# Patient Record
Sex: Male | Born: 1945 | Race: White | Hispanic: No | Marital: Married | Smoking: Never smoker
Health system: Southern US, Community
[De-identification: ages and names within clinical notes are randomized; demographics above are authoritative.]

## PROBLEM LIST (undated history)

## (undated) DIAGNOSIS — M109 Gout, unspecified: Secondary | ICD-10-CM

## (undated) DIAGNOSIS — G44309 Post-traumatic headache, unspecified, not intractable: Secondary | ICD-10-CM

## (undated) DIAGNOSIS — M1990 Unspecified osteoarthritis, unspecified site: Secondary | ICD-10-CM

## (undated) DIAGNOSIS — E1169 Type 2 diabetes mellitus with other specified complication: Secondary | ICD-10-CM

## (undated) DIAGNOSIS — E785 Hyperlipidemia, unspecified: Secondary | ICD-10-CM

## (undated) DIAGNOSIS — H9193 Unspecified hearing loss, bilateral: Secondary | ICD-10-CM

## (undated) DIAGNOSIS — I10 Essential (primary) hypertension: Secondary | ICD-10-CM

## (undated) DIAGNOSIS — F410 Panic disorder [episodic paroxysmal anxiety] without agoraphobia: Secondary | ICD-10-CM

## (undated) DIAGNOSIS — R972 Elevated prostate specific antigen [PSA]: Secondary | ICD-10-CM

## (undated) DIAGNOSIS — N401 Enlarged prostate with lower urinary tract symptoms: Secondary | ICD-10-CM

## (undated) HISTORY — DX: Post-traumatic headache, unspecified, not intractable: G44.309

## (undated) HISTORY — PX: CATARACT EXTRACTION: SUR2

## (undated) HISTORY — PX: SKIN CANCER EXCISION: SHX779

## (undated) HISTORY — PX: TOTAL KNEE ARTHROPLASTY: SHX125

## (undated) HISTORY — DX: Hyperlipidemia, unspecified: E78.5

## (undated) HISTORY — DX: Unspecified hearing loss, bilateral: H91.93

## (undated) HISTORY — DX: Elevated prostate specific antigen (PSA): R97.20

## (undated) HISTORY — DX: Panic disorder (episodic paroxysmal anxiety): F41.0

## (undated) HISTORY — DX: Type 2 diabetes mellitus with other specified complication: E11.69

## (undated) HISTORY — DX: Benign prostatic hyperplasia with lower urinary tract symptoms: N40.1

## (undated) HISTORY — DX: Gout, unspecified: M10.9

---
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
## 2005-06-03 ENCOUNTER — Encounter: Admission: RE | Admit: 2005-06-03 | Discharge: 2005-06-03 | Payer: Self-pay | Admitting: Orthopedic Surgery

## 2005-06-04 ENCOUNTER — Encounter: Admission: RE | Admit: 2005-06-04 | Discharge: 2005-06-04 | Payer: Self-pay | Admitting: Orthopedic Surgery

## 2005-07-15 ENCOUNTER — Encounter: Admission: RE | Admit: 2005-07-15 | Discharge: 2005-07-15 | Payer: Self-pay | Admitting: Orthopedic Surgery

## 2005-07-29 ENCOUNTER — Encounter: Admission: RE | Admit: 2005-07-29 | Discharge: 2005-07-29 | Payer: Self-pay | Admitting: Orthopedic Surgery

## 2005-09-02 ENCOUNTER — Encounter: Admission: RE | Admit: 2005-09-02 | Discharge: 2005-09-02 | Payer: Self-pay | Admitting: Orthopedic Surgery

## 2008-10-03 HISTORY — PX: TOTAL KNEE ARTHROPLASTY: SHX125

## 2010-01-08 ENCOUNTER — Inpatient Hospital Stay (HOSPITAL_COMMUNITY): Admission: RE | Admit: 2010-01-08 | Discharge: 2010-01-11 | Payer: Self-pay | Admitting: Orthopedic Surgery

## 2010-10-23 ENCOUNTER — Encounter: Payer: Self-pay | Admitting: Orthopedic Surgery

## 2010-12-22 LAB — CBC
HCT: 29.8 % — ABNORMAL LOW (ref 39.0–52.0)
HCT: 32.4 % — ABNORMAL LOW (ref 39.0–52.0)
HCT: 33.8 % — ABNORMAL LOW (ref 39.0–52.0)
HCT: 43.8 % (ref 39.0–52.0)
Hemoglobin: 10.4 g/dL — ABNORMAL LOW (ref 13.0–17.0)
Hemoglobin: 11.3 g/dL — ABNORMAL LOW (ref 13.0–17.0)
Hemoglobin: 11.7 g/dL — ABNORMAL LOW (ref 13.0–17.0)
Hemoglobin: 15.1 g/dL (ref 13.0–17.0)
MCHC: 34.4 g/dL (ref 30.0–36.0)
MCHC: 34.6 g/dL (ref 30.0–36.0)
MCHC: 34.8 g/dL (ref 30.0–36.0)
MCHC: 34.8 g/dL (ref 30.0–36.0)
MCV: 95.4 fL (ref 78.0–100.0)
MCV: 95.7 fL (ref 78.0–100.0)
MCV: 95.9 fL (ref 78.0–100.0)
MCV: 96.7 fL (ref 78.0–100.0)
Platelets: 177 10*3/uL (ref 150–400)
Platelets: 186 10*3/uL (ref 150–400)
Platelets: 207 10*3/uL (ref 150–400)
Platelets: 244 10*3/uL (ref 150–400)
RBC: 3.11 MIL/uL — ABNORMAL LOW (ref 4.22–5.81)
RBC: 3.35 MIL/uL — ABNORMAL LOW (ref 4.22–5.81)
RBC: 3.53 MIL/uL — ABNORMAL LOW (ref 4.22–5.81)
RBC: 4.59 MIL/uL (ref 4.22–5.81)
RDW: 13.2 % (ref 11.5–15.5)
RDW: 13.4 % (ref 11.5–15.5)
RDW: 13.4 % (ref 11.5–15.5)
RDW: 13.4 % (ref 11.5–15.5)
WBC: 10.1 10*3/uL (ref 4.0–10.5)
WBC: 12.3 10*3/uL — ABNORMAL HIGH (ref 4.0–10.5)
WBC: 13.9 10*3/uL — ABNORMAL HIGH (ref 4.0–10.5)
WBC: 9.4 10*3/uL (ref 4.0–10.5)

## 2010-12-22 LAB — ABO/RH: ABO/RH(D): A POS

## 2010-12-22 LAB — BASIC METABOLIC PANEL
BUN: 15 mg/dL (ref 6–23)
BUN: 15 mg/dL (ref 6–23)
BUN: 19 mg/dL (ref 6–23)
CO2: 27 mEq/L (ref 19–32)
CO2: 28 mEq/L (ref 19–32)
CO2: 28 mEq/L (ref 19–32)
Calcium: 8 mg/dL — ABNORMAL LOW (ref 8.4–10.5)
Calcium: 8.1 mg/dL — ABNORMAL LOW (ref 8.4–10.5)
Calcium: 8.2 mg/dL — ABNORMAL LOW (ref 8.4–10.5)
Chloride: 100 mEq/L (ref 96–112)
Chloride: 102 mEq/L (ref 96–112)
Chloride: 102 mEq/L (ref 96–112)
Creatinine, Ser: 1.08 mg/dL (ref 0.4–1.5)
Creatinine, Ser: 1.2 mg/dL (ref 0.4–1.5)
Creatinine, Ser: 1.46 mg/dL (ref 0.4–1.5)
GFR calc Af Amer: 59 mL/min — ABNORMAL LOW (ref >60)
GFR calc Af Amer: >60 mL/min (ref >60)
GFR calc Af Amer: >60 mL/min (ref >60)
GFR calc non Af Amer: 48 mL/min — ABNORMAL LOW (ref >60)
GFR calc non Af Amer: >60 mL/min (ref >60)
GFR calc non Af Amer: >60 mL/min (ref >60)
Glucose, Bld: 114 mg/dL — ABNORMAL HIGH (ref 70–99)
Glucose, Bld: 122 mg/dL — ABNORMAL HIGH (ref 70–99)
Glucose, Bld: 164 mg/dL — ABNORMAL HIGH (ref 70–99)
Potassium: 3.5 mEq/L (ref 3.5–5.1)
Potassium: 3.9 mEq/L (ref 3.5–5.1)
Potassium: 3.9 mEq/L (ref 3.5–5.1)
Sodium: 134 mEq/L — ABNORMAL LOW (ref 135–145)
Sodium: 135 mEq/L (ref 135–145)
Sodium: 136 mEq/L (ref 135–145)

## 2010-12-22 LAB — COMPREHENSIVE METABOLIC PANEL
ALT: 33 U/L (ref 0–53)
AST: 24 U/L (ref 0–37)
Albumin: 4.1 g/dL (ref 3.5–5.2)
Alkaline Phosphatase: 69 U/L (ref 39–117)
BUN: 18 mg/dL (ref 6–23)
CO2: 30 mEq/L (ref 19–32)
Calcium: 9.6 mg/dL (ref 8.4–10.5)
Chloride: 104 mEq/L (ref 96–112)
Creatinine, Ser: 1.24 mg/dL (ref 0.4–1.5)
GFR calc Af Amer: >60 mL/min (ref >60)
GFR calc non Af Amer: 59 mL/min — ABNORMAL LOW (ref >60)
Glucose, Bld: 100 mg/dL — ABNORMAL HIGH (ref 70–99)
Potassium: 3.9 mEq/L (ref 3.5–5.1)
Sodium: 140 mEq/L (ref 135–145)
Total Bilirubin: 0.6 mg/dL (ref 0.3–1.2)
Total Protein: 6.8 g/dL (ref 6.0–8.3)

## 2010-12-22 LAB — TYPE AND SCREEN
ABO/RH(D): A POS
Antibody Screen: NEGATIVE

## 2010-12-22 LAB — URINALYSIS, ROUTINE W REFLEX MICROSCOPIC
Bilirubin Urine: NEGATIVE
Glucose, UA: NEGATIVE mg/dL
Ketones, ur: NEGATIVE mg/dL
Leukocytes, UA: NEGATIVE
Nitrite: NEGATIVE
Protein, ur: NEGATIVE mg/dL
Specific Gravity, Urine: 1.017 (ref 1.005–1.030)
Urobilinogen, UA: 0.2 mg/dL (ref 0.0–1.0)
pH: 6 (ref 5.0–8.0)

## 2010-12-22 LAB — DIFFERENTIAL
Basophils Absolute: 0.1 10*3/uL (ref 0.0–0.1)
Basophils Relative: 1 % (ref 0–1)
Eosinophils Absolute: 0.2 10*3/uL (ref 0.0–0.7)
Eosinophils Relative: 2 % (ref 0–5)
Lymphocytes Relative: 31 % (ref 12–46)
Lymphs Abs: 2.9 10*3/uL (ref 0.7–4.0)
Monocytes Absolute: 1 10*3/uL (ref 0.1–1.0)
Monocytes Relative: 11 % (ref 3–12)
Neutro Abs: 5.2 10*3/uL (ref 1.7–7.7)
Neutrophils Relative %: 56 % (ref 43–77)

## 2010-12-22 LAB — PROTIME-INR
INR: 0.96 (ref 0.00–1.49)
Prothrombin Time: 12.7 seconds (ref 11.6–15.2)

## 2010-12-22 LAB — URINE CULTURE
Colony Count: NO GROWTH
Culture: NO GROWTH

## 2010-12-22 LAB — URINE MICROSCOPIC-ADD ON

## 2010-12-22 LAB — APTT: aPTT: 26 seconds (ref 24–37)

## 2013-10-03 HISTORY — PX: CATARACT EXTRACTION: SUR2

## 2014-09-10 ENCOUNTER — Emergency Department (HOSPITAL_COMMUNITY): Payer: Commercial Managed Care - HMO

## 2014-09-10 ENCOUNTER — Encounter (HOSPITAL_COMMUNITY): Payer: Self-pay | Admitting: Vascular Surgery

## 2014-09-10 ENCOUNTER — Emergency Department (HOSPITAL_COMMUNITY)
Admission: EM | Admit: 2014-09-10 | Discharge: 2014-09-10 | Disposition: A | Discharge status: Home / Self Care | Payer: Commercial Managed Care - HMO | Attending: Emergency Medicine | Admitting: Emergency Medicine

## 2014-09-10 DIAGNOSIS — L089 Local infection of the skin and subcutaneous tissue, unspecified: Secondary | ICD-10-CM | POA: Diagnosis not present

## 2014-09-10 DIAGNOSIS — S1081XA Abrasion of other specified part of neck, initial encounter: Secondary | ICD-10-CM

## 2014-09-10 DIAGNOSIS — S02401A Maxillary fracture, unspecified, initial encounter for closed fracture: Secondary | ICD-10-CM | POA: Diagnosis not present

## 2014-09-10 DIAGNOSIS — S0990XA Unspecified injury of head, initial encounter: Secondary | ICD-10-CM | POA: Diagnosis present

## 2014-09-10 DIAGNOSIS — Y9289 Other specified places as the place of occurrence of the external cause: Secondary | ICD-10-CM | POA: Insufficient documentation

## 2014-09-10 DIAGNOSIS — Y998 Other external cause status: Secondary | ICD-10-CM | POA: Insufficient documentation

## 2014-09-10 DIAGNOSIS — S3992XA Unspecified injury of lower back, initial encounter: Secondary | ICD-10-CM

## 2014-09-10 DIAGNOSIS — Y9389 Activity, other specified: Secondary | ICD-10-CM | POA: Insufficient documentation

## 2014-09-10 DIAGNOSIS — S0081XA Abrasion of other part of head, initial encounter: Secondary | ICD-10-CM | POA: Insufficient documentation

## 2014-09-10 DIAGNOSIS — S199XXA Unspecified injury of neck, initial encounter: Secondary | ICD-10-CM

## 2014-09-10 DIAGNOSIS — W01198A Fall on same level from slipping, tripping and stumbling with subsequent striking against other object, initial encounter: Secondary | ICD-10-CM | POA: Insufficient documentation

## 2014-09-10 DIAGNOSIS — S20212A Contusion of left front wall of thorax, initial encounter: Secondary | ICD-10-CM | POA: Diagnosis not present

## 2014-09-10 DIAGNOSIS — I10 Essential (primary) hypertension: Secondary | ICD-10-CM | POA: Insufficient documentation

## 2014-09-10 HISTORY — DX: Essential (primary) hypertension: I10

## 2014-09-10 LAB — I-STAT CHEM 8, ED
BUN: 16 mg/dL (ref 6–23)
CALCIUM ION: 1.16 mmol/L (ref 1.13–1.30)
CHLORIDE: 103 meq/L (ref 96–112)
Creatinine, Ser: 1.1 mg/dL (ref 0.50–1.35)
GLUCOSE: 132 mg/dL — AB (ref 70–99)
HCT: 48 % (ref 39.0–52.0)
Hemoglobin: 16.3 g/dL (ref 13.0–17.0)
Potassium: 3.8 mEq/L (ref 3.7–5.3)
Sodium: 141 mEq/L (ref 137–147)
TCO2: 24 mmol/L (ref 0–100)

## 2014-09-10 LAB — URINALYSIS, ROUTINE W REFLEX MICROSCOPIC
Bilirubin Urine: NEGATIVE
Glucose, UA: NEGATIVE mg/dL
Hgb urine dipstick: NEGATIVE
KETONES UR: NEGATIVE mg/dL
Leukocytes, UA: NEGATIVE
NITRITE: NEGATIVE
PH: 7 (ref 5.0–8.0)
Protein, ur: NEGATIVE mg/dL
Specific Gravity, Urine: 1.015 (ref 1.005–1.030)
Urobilinogen, UA: 0.2 mg/dL (ref 0.0–1.0)

## 2014-09-10 LAB — HEPATIC FUNCTION PANEL
ALK PHOS: 59 U/L (ref 39–117)
ALT: 45 U/L (ref 0–53)
AST: 33 U/L (ref 0–37)
Albumin: 3.8 g/dL (ref 3.5–5.2)
Bilirubin, Direct: <0.2 mg/dL (ref 0.0–0.3)
TOTAL PROTEIN: 6.9 g/dL (ref 6.0–8.3)
Total Bilirubin: 0.6 mg/dL (ref 0.3–1.2)

## 2014-09-10 LAB — CBC WITH DIFFERENTIAL/PLATELET
Basophils Absolute: 0 10*3/uL (ref 0.0–0.1)
Basophils Relative: 0 % (ref 0–1)
EOS ABS: 0 10*3/uL (ref 0.0–0.7)
Eosinophils Relative: 0 % (ref 0–5)
HEMATOCRIT: 43 % (ref 39.0–52.0)
HEMOGLOBIN: 14.6 g/dL (ref 13.0–17.0)
Lymphocytes Relative: 8 % — ABNORMAL LOW (ref 12–46)
Lymphs Abs: 1.1 10*3/uL (ref 0.7–4.0)
MCH: 31.9 pg (ref 26.0–34.0)
MCHC: 34 g/dL (ref 30.0–36.0)
MCV: 93.9 fL (ref 78.0–100.0)
MONO ABS: 0.9 10*3/uL (ref 0.1–1.0)
MONOS PCT: 7 % (ref 3–12)
NEUTROS PCT: 85 % — AB (ref 43–77)
Neutro Abs: 11.9 10*3/uL — ABNORMAL HIGH (ref 1.7–7.7)
Platelets: 196 10*3/uL (ref 150–400)
RBC: 4.58 MIL/uL (ref 4.22–5.81)
RDW: 12.9 % (ref 11.5–15.5)
WBC: 14 10*3/uL — ABNORMAL HIGH (ref 4.0–10.5)

## 2014-09-10 MED ORDER — AMOXICILLIN 500 MG PO CAPS: 500.0000 mg | ORAL_CAPSULE | Freq: Three times a day (TID) | ORAL | Status: DC

## 2014-09-10 MED ORDER — IOHEXOL 300 MG/ML  SOLN
100.0000 mL | Freq: Once | INTRAMUSCULAR | Status: AC | PRN
Start: 2014-09-10 — End: 2014-09-10
  Administered 2014-09-10: 100 mL via INTRAVENOUS

## 2014-09-10 MED ORDER — TETANUS-DIPHTH-ACELL PERTUSSIS 5-2.5-18.5 LF-MCG/0.5 IM SUSP
0.5000 mL | Freq: Once | INTRAMUSCULAR | Status: AC
  Administered 2014-09-10: 0.5 mL via INTRAMUSCULAR
  Filled 2014-09-10: qty 0.5

## 2014-09-10 MED ORDER — FENTANYL CITRATE 0.05 MG/ML IJ SOLN
100.0000 ug | INTRAMUSCULAR | Status: DC | PRN
  Administered 2014-09-10: 100 ug via INTRAVENOUS
  Filled 2014-09-10: qty 2

## 2014-09-10 MED ORDER — HYDROCODONE-ACETAMINOPHEN 5-325 MG PO TABS
1.0000 | ORAL_TABLET | ORAL | Status: DC | PRN
Start: 2014-09-10 — End: 2015-11-13

## 2014-09-10 MED ORDER — ONDANSETRON HCL 4 MG/2ML IJ SOLN
4.0000 mg | Freq: Once | INTRAMUSCULAR | Status: AC
  Administered 2014-09-10: 4 mg via INTRAVENOUS
  Filled 2014-09-10: qty 2

## 2014-09-10 NOTE — ED Notes (Signed)
Pt in ct 

## 2014-09-10 NOTE — Discharge Instructions (Signed)
Use a warm moist compress on the facial abrasion 3 times a day for 30 minutes. Keep the wound clean with soap and water. Apply an ointment, like Bacitracin to the wound after cleansing. Return here, if needed, for problems. Follow-up with the nose and throat doctor about the facial injury, and maxillary sinus fracture in one or two weeks.    Abrasion An abrasion is a cut or scrape of the skin. Abrasions do not extend through all layers of the skin and most heal within 10 days. It is important to care for your abrasion properly to prevent infection. CAUSES  Most abrasions are caused by falling on, or gliding across, the ground or other surface. When your skin rubs on something, the outer and inner layer of skin rubs off, causing an abrasion. DIAGNOSIS  Your caregiver will be able to diagnose an abrasion during a physical exam.  TREATMENT  Your treatment depends on how large and deep the abrasion is. Generally, your abrasion will be cleaned with water and a mild soap to remove any dirt or debris. An antibiotic ointment may be put over the abrasion to prevent an infection. A bandage (dressing) may be wrapped around the abrasion to keep it from getting dirty.  You may need a tetanus shot if:  You cannot remember when you had your last tetanus shot.  You have never had a tetanus shot.  The injury broke your skin. If you get a tetanus shot, your arm may swell, get red, and feel warm to the touch. This is common and not a problem. If you need a tetanus shot and you choose not to have one, there is a rare chance of getting tetanus. Sickness from tetanus can be serious.  HOME CARE INSTRUCTIONS   If a dressing was applied, change it at least once a day or as directed by your caregiver. If the bandage sticks, soak it off with warm water.   Wash the area with water and a mild soap to remove all the ointment 2 times a day. Rinse off the soap and pat the area dry with a clean towel.   Reapply any  ointment as directed by your caregiver. This will help prevent infection and keep the bandage from sticking. Use gauze over the wound and under the dressing to help keep the bandage from sticking.   Change your dressing right away if it becomes wet or dirty.   Only take over-the-counter or prescription medicines for pain, discomfort, or fever as directed by your caregiver.   Follow up with your caregiver within 24-48 hours for a wound check, or as directed. If you were not given a wound-check appointment, look closely at your abrasion for redness, swelling, or pus. These are signs of infection. SEEK IMMEDIATE MEDICAL CARE IF:   You have increasing pain in the wound.   You have redness, swelling, or tenderness around the wound.   You have pus coming from the wound.   You have a fever or persistent symptoms for more than 2-3 days.  You have a fever and your symptoms suddenly get worse.  You have a bad smell coming from the wound or dressing.  MAKE SURE YOU:   Understand these instructions.  Will watch your condition.  Will get help right away if you are not doing well or get worse. Document Released: 06/29/2005 Document Revised: 09/05/2012 Document Reviewed: 08/23/2011 [facility] Patient Information 2015 [place], [place]. This information is not intended to replace advice given to you by  your health care provider. Make sure you discuss any questions you have with your health care provider.  Facial Fracture A facial fracture is a break in one of the bones of your face. HOME CARE INSTRUCTIONS   Protect the injured part of your face until it is healed.  Do not participate in activities which give chance for re-injury until your doctor approves.  Gently wash and dry your face.  Wear head and facial protection while riding a bicycle, motorcycle, or snowmobile. SEEK MEDICAL CARE IF:   An oral temperature above 102 F (38.9 C) develops.  You have severe headaches or notice  changes in your vision.  You have new numbness or tingling in your face.  You develop nausea (feeling sick to your stomach), vomiting or a stiff neck. SEEK IMMEDIATE MEDICAL CARE IF:   You develop difficulty seeing or experience double vision.  You become dizzy, lightheaded, or faint.  You develop trouble speaking, breathing, or swallowing.  You have a watery discharge from your nose or ear. MAKE SURE YOU:   Understand these instructions.  Will watch your condition.  Will get help right away if you are not doing well or get worse. Document Released: 09/19/2005 Document Revised: 12/12/2011 Document Reviewed: 05/08/2008 [facility] Patient Information 2015 [place], [place]. This information is not intended to replace advice given to you by your health care provider. Make sure you discuss any questions you have with your health care provider.  Contusion A contusion is a deep bruise. Contusions are the result of an injury that caused bleeding under the skin. The contusion may turn blue, purple, or yellow. Minor injuries will give you a painless contusion, but more severe contusions may stay painful and swollen for a few weeks.  CAUSES  A contusion is usually caused by a blow, trauma, or direct force to an area of the body. SYMPTOMS   Swelling and redness of the injured area.  Bruising of the injured area.  Tenderness and soreness of the injured area.  Pain. DIAGNOSIS  The diagnosis can be made by taking a history and physical exam. An X-ray, CT scan, or MRI may be needed to determine if there were any associated injuries, such as fractures. TREATMENT  Specific treatment will depend on what area of the body was injured. In general, the best treatment for a contusion is resting, icing, elevating, and applying cold compresses to the injured area. Over-the-counter medicines may also be recommended for pain control. Ask your caregiver what the best treatment is for your contusion. HOME  CARE INSTRUCTIONS   Put ice on the injured area.  Put ice in a plastic bag.  Place a towel between your skin and the bag.  Leave the ice on for 15-20 minutes, 3-4 times a day, or as directed by your health care provider.  Only take over-the-counter or prescription medicines for pain, discomfort, or fever as directed by your caregiver. Your caregiver may recommend avoiding anti-inflammatory medicines (aspirin, ibuprofen, and naproxen) for 48 hours because these medicines may increase bruising.  Rest the injured area.  If possible, elevate the injured area to reduce swelling. SEEK IMMEDIATE MEDICAL CARE IF:   You have increased bruising or swelling.  You have pain that is getting worse.  Your swelling or pain is not relieved with medicines. MAKE SURE YOU:   Understand these instructions.  Will watch your condition.  Will get help right away if you are not doing well or get worse. Document Released: 06/29/2005 Document Revised: 09/24/2013  Document Reviewed: 07/25/2011 [facility] Patient Information 2015 [place], [place]. This information is not intended to replace advice given to you by your health care provider. Make sure you discuss any questions you have with your health care provider.  Head Injury You have received a head injury. It does not appear serious at this time. Headaches and vomiting are common following head injury. It should be easy to awaken from sleeping. Sometimes it is necessary for you to stay in the emergency department for a while for observation. Sometimes admission to the hospital may be needed. After injuries such as yours, most problems occur within the first 24 hours, but side effects may occur up to 7-10 days after the injury. It is important for you to carefully monitor your condition and contact your health care provider or seek immediate medical care if there is a change in your condition. WHAT ARE THE TYPES OF HEAD INJURIES? Head injuries can be as minor  as a bump. Some head injuries can be more severe. More severe head injuries include:  A jarring injury to the brain (concussion).  A bruise of the brain (contusion). This mean there is bleeding in the brain that can cause swelling.  A cracked skull (skull fracture).  Bleeding in the brain that collects, clots, and forms a bump (hematoma). WHAT CAUSES A HEAD INJURY? A serious head injury is most likely to happen to someone who is in a car wreck and is not wearing a seat belt. Other causes of major head injuries include bicycle or motorcycle accidents, sports injuries, and falls. HOW ARE HEAD INJURIES DIAGNOSED? A complete history of the event leading to the injury and your current symptoms will be helpful in diagnosing head injuries. Many times, pictures of the brain, such as CT or MRI are needed to see the extent of the injury. Often, an overnight hospital stay is necessary for observation.  WHEN SHOULD I SEEK IMMEDIATE MEDICAL CARE?  You should get help right away if:  You have confusion or drowsiness.  You feel sick to your stomach (nauseous) or have continued, forceful vomiting.  You have dizziness or unsteadiness that is getting worse.  You have severe, continued headaches not relieved by medicine. Only take over-the-counter or prescription medicines for pain, fever, or discomfort as directed by your health care provider.  You do not have normal function of the arms or legs or are unable to walk.  You notice changes in the black spots in the center of the colored part of your eye (pupil).  You have a clear or bloody fluid coming from your nose or ears.  You have a loss of vision. During the next 24 hours after the injury, you must stay with someone who can watch you for the warning signs. This person should contact local emergency services (911 in the [place] if you have seizures, you become unconscious, or you are unable to wake up. HOW CAN I PREVENT A HEAD INJURY IN THE  FUTURE? The most important factor for preventing major head injuries is avoiding motor vehicle accidents. To minimize the potential for damage to your head, it is crucial to wear seat belts while riding in motor vehicles. Wearing helmets while bike riding and playing collision sports (like football) is also helpful. Also, avoiding dangerous activities around the house will further help reduce your risk of head injury.  WHEN CAN I RETURN TO NORMAL ACTIVITIES AND ATHLETICS? You should be reevaluated by your health care provider before returning to these activities.  If you have any of the following symptoms, you should not return to activities or contact sports until 1 week after the symptoms have stopped:  Persistent headache.  Dizziness or vertigo.  Poor attention and concentration.  Confusion.  Memory problems.  Nausea or vomiting.  Fatigue or tire easily.  Irritability.  Intolerant of bright lights or loud noises.  Anxiety or depression.  Disturbed sleep. MAKE SURE YOU:   Understand these instructions.  Will watch your condition.  Will get help right away if you are not doing well or get worse. Document Released: 09/19/2005 Document Revised: 09/24/2013 Document Reviewed: 05/27/2013 [facility] Patient Information 2015 [place], [place]. This information is not intended to replace advice given to you by your health care provider. Make sure you discuss any questions you have with your health care provider.

## 2014-09-10 NOTE — ED Notes (Signed)
Lab called and Clinical research associatewriter informed cbc tube "clotted" reordered

## 2014-09-10 NOTE — ED Notes (Signed)
Pt returned from ct

## 2014-09-10 NOTE — ED Provider Notes (Signed)
CSN: [id number]     Arrival date & time 09/10/14  1117 History   First MD Initiated Contact with Patient 09/10/14 1142     Chief Complaint  Patient presents with  . Fall     (Consider location/radiation/quality/duration/timing/severity/associated sxs/prior Treatment) HPI  Larry Haynes is a 68 y.o. male who presents for evaluation of fall.  He states that he fell "from a loader", "about as high as your ceiling".  As he fell, he scraped against some equipment.  He was unconscious for 30 seconds, then disoriented for a few minutes after the fall.  He was able to then get up and walk with assistance.  He came here for evaluation, by EMS.  He is accompanied by his son, who saw him fall.  He has chronic back pain and feels like he injured it when he fell.  He also complains of headache, neck pain and upper chest pain.  He cannot recall his last tetanus booster.  Has not had a recent illnesses.  There are no other known modifying factors.   Past Medical History  Diagnosis Date  . Hypertension    Past Surgical History  Procedure Laterality Date  . Total knee arthroplasty Left   . Cataract extraction     History reviewed. No pertinent family history. History  Substance Use Topics  . Smoking status: Never Smoker   . Smokeless tobacco: Never Used  . Alcohol Use: No    Review of Systems  All other systems reviewed and are negative.     Allergies  Review of patient's allergies indicates not on file.  Home Medications   Prior to Admission medications   Not on File   BP 147/71 mmHg  Pulse 47  Temp(Src) 97.7 F (36.5 C) (Oral)  Resp 16  SpO2 97% Physical Exam  Constitutional: He is oriented to person, place, and time. He appears well-developed.  Elderly, robust  HENT:  Head: Normocephalic and atraumatic.  Right Ear: External ear normal.  Left Ear: External ear normal.  Eyes: Conjunctivae and EOM are normal. Pupils are equal, round, and reactive to light.  Neck: Normal  range of motion and phonation normal. Neck supple.  Cardiovascular: Normal rate, regular rhythm and normal heart sounds.   Pulmonary/Chest: Effort normal and breath sounds normal. He exhibits tenderness (Mild left upper chest wall tenderness . No deformity or crepitation of the lateral chest walls.). He exhibits no bony tenderness.  Abdominal: Soft. He exhibits no mass. There is no tenderness. There is no guarding.  No apparent abdominal wall injury  Musculoskeletal: Normal range of motion.  Mild tenderness of the posterior and left lateral neck.  There are contusions of the left lower anterior neck and across the left clavicle.  There is no clavicular deformity.  No tenderness of the thoracic or lumbar spines.  Normal range of motion.  Arms and legs bilaterally.  Superficial abrasions of the left and right anterior lower legs.  Neurological: He is alert and oriented to person, place, and time. No cranial nerve deficit or sensory deficit. He exhibits normal muscle tone. Coordination normal.  Skin: Skin is warm, dry and intact.  Psychiatric: He has a normal mood and affect. His behavior is normal. Judgment and thought content normal.  Nursing note and vitals reviewed.   ED Course  Procedures (including critical care time)  Medications  fentaNYL (SUBLIMAZE) injection 100 mcg (100 mcg Intravenous Given 09/10/14 1216)  ondansetron (ZOFRAN) injection 4 mg (4 mg Intravenous Given 09/10/14 1216)  Tdap (BOOSTRIX) injection 0.5 mL (0.5 mLs Intramuscular Given 09/10/14 1216)  iohexol (OMNIPAQUE) 300 MG/ML solution 100 mL (100 mLs Intravenous Contrast Given 09/10/14 1329)    Patient Vitals for the past 24 hrs:  BP Temp Temp src Pulse Resp SpO2  09/10/14 1430 138/58 mmHg - - (!) 54 21 93 %  09/10/14 1300 114/59 mmHg - - (!) 44 19 92 %  09/10/14 1240 121/58 mmHg - - (!) 48 20 92 %  09/10/14 1230 (!) 109/51 mmHg - - (!) 45 16 90 %  09/10/14 1200 147/71 mmHg - - (!) 47 16 97 %  09/10/14 1145 (!) 156/127  mmHg - - (!) 54 17 97 %  09/10/14 1132 154/62 mmHg 97.7 F (36.5 C) Oral (!) 44 17 96 %  09/10/14 1120 - - - - - 98 %    3:48 PM Reevaluation with update and discussion. After initial assessment and treatment, an updated evaluation reveals he is comfortable at this time.  He does not have any left anterior or left mid lateral chest wall tenderness.  His wounds have been cleansed.  Findings discussed with patient and son, all questions answered.Mancel Bale. Koah Chisenhall L   Labs Review Labs Reviewed  HEPATIC FUNCTION PANEL  CBC WITH DIFFERENTIAL  URINALYSIS, ROUTINE W REFLEX MICROSCOPIC  I-STAT CHEM 8, ED    Imaging Review No results found.   EKG Interpretation None      MDM   Final diagnoses:  Head injury, initial encounter  Maxillary sinus fracture, closed, initial encounter  Abrasion, face with infection, initial encounter  Contusion, chest wall, left, initial encounter  Back injury, initial encounter  Neck injury, initial encounter    Head injury with multiple contusions and abrasions secondary to fall. Left face , maxillary sinus fracture.  No intervention required for facial fracture.   Nursing Notes Reviewed/ Care Coordinated Applicable Imaging Reviewed Interpretation of Laboratory Data incorporated into ED treatment  The patient appears reasonably screened and/or stabilized for discharge and I doubt any other medical condition or other [facility] requiring further screening, evaluation, or treatment in the ED at this time prior to discharge.  Plan: Home Medications- Amoxicillin for prophylaxis of sinus wall fracture, Norco; Home Treatments- rest, wound care; return here if the recommended treatment, does not improve the symptoms; Recommended follow up- ENT 1 week. PCP prn   Flint MelterElliott L Robynne Roat, MD 09/10/14 [phone]

## 2014-09-10 NOTE — ED Notes (Signed)
Pt reports to the ED via [facility] following a fall. He was on a piece of logging equipment and he tripped and fell and hit his back on a tire and then the left side of his face on a steel piece of equipment. Complaining of pain to the left side of his face and his low back and left shin. Reports positive LOC. Denies being on blood thinners. Pt bradycardic however, that is baseline. Lacerations noted to the left side of his face. Bleeding controlled. Denies any numbness or tingling. Pt ambulatory on scene. Pt A&Ox4, resp e/u, and skin warm and dry.

## 2014-11-07 DIAGNOSIS — S32009A Unspecified fracture of unspecified lumbar vertebra, initial encounter for closed fracture: Secondary | ICD-10-CM | POA: Insufficient documentation

## 2015-02-06 ENCOUNTER — Encounter (HOSPITAL_COMMUNITY): Payer: Self-pay | Admitting: Cardiology

## 2015-02-06 ENCOUNTER — Emergency Department (HOSPITAL_COMMUNITY): Payer: Commercial Managed Care - HMO

## 2015-02-06 ENCOUNTER — Emergency Department (HOSPITAL_COMMUNITY)
Admission: EM | Admit: 2015-02-06 | Discharge: 2015-02-06 | Disposition: A | Discharge status: Home / Self Care | Payer: Commercial Managed Care - HMO | Attending: Emergency Medicine | Admitting: Emergency Medicine

## 2015-02-06 DIAGNOSIS — I10 Essential (primary) hypertension: Secondary | ICD-10-CM | POA: Diagnosis not present

## 2015-02-06 DIAGNOSIS — M7918 Myalgia, other site: Secondary | ICD-10-CM

## 2015-02-06 DIAGNOSIS — M791 Myalgia: Secondary | ICD-10-CM | POA: Insufficient documentation

## 2015-02-06 DIAGNOSIS — R1012 Left upper quadrant pain: Secondary | ICD-10-CM | POA: Insufficient documentation

## 2015-02-06 DIAGNOSIS — Z79899 Other long term (current) drug therapy: Secondary | ICD-10-CM | POA: Diagnosis not present

## 2015-02-06 DIAGNOSIS — R109 Unspecified abdominal pain: Secondary | ICD-10-CM | POA: Diagnosis present

## 2015-02-06 LAB — URINALYSIS, ROUTINE W REFLEX MICROSCOPIC
Bilirubin Urine: NEGATIVE
Glucose, UA: NEGATIVE mg/dL
Hgb urine dipstick: NEGATIVE
Ketones, ur: NEGATIVE mg/dL
Leukocytes, UA: NEGATIVE
NITRITE: NEGATIVE
PROTEIN: NEGATIVE mg/dL
Specific Gravity, Urine: 1.012 (ref 1.005–1.030)
UROBILINOGEN UA: 0.2 mg/dL (ref 0.0–1.0)
pH: 6.5 (ref 5.0–8.0)

## 2015-02-06 LAB — CBC WITH DIFFERENTIAL/PLATELET
BASOS PCT: 1 % (ref 0–1)
Basophils Absolute: 0 10*3/uL (ref 0.0–0.1)
EOS ABS: 0.2 10*3/uL (ref 0.0–0.7)
Eosinophils Relative: 3 % (ref 0–5)
HEMATOCRIT: 43.3 % (ref 39.0–52.0)
Hemoglobin: 14.9 g/dL (ref 13.0–17.0)
Lymphocytes Relative: 29 % (ref 12–46)
Lymphs Abs: 1.9 10*3/uL (ref 0.7–4.0)
MCH: 32.3 pg (ref 26.0–34.0)
MCHC: 34.4 g/dL (ref 30.0–36.0)
MCV: 93.9 fL (ref 78.0–100.0)
Monocytes Absolute: 0.6 10*3/uL (ref 0.1–1.0)
Monocytes Relative: 10 % (ref 3–12)
NEUTROS PCT: 57 % (ref 43–77)
Neutro Abs: 3.8 10*3/uL (ref 1.7–7.7)
PLATELETS: 206 10*3/uL (ref 150–400)
RBC: 4.61 MIL/uL (ref 4.22–5.81)
RDW: 13.4 % (ref 11.5–15.5)
WBC: 6.6 10*3/uL (ref 4.0–10.5)

## 2015-02-06 LAB — BASIC METABOLIC PANEL
Anion gap: 9 (ref 5–15)
BUN: 16 mg/dL (ref 6–20)
CO2: 25 mmol/L (ref 22–32)
CREATININE: 1.12 mg/dL (ref 0.61–1.24)
Calcium: 8.9 mg/dL (ref 8.9–10.3)
Chloride: 104 mmol/L (ref 101–111)
GFR calc Af Amer: >60 mL/min (ref >60)
Glucose, Bld: 137 mg/dL — ABNORMAL HIGH (ref 70–99)
Potassium: 4 mmol/L (ref 3.5–5.1)
Sodium: 138 mmol/L (ref 135–145)

## 2015-02-06 MED ORDER — HYDROCODONE-ACETAMINOPHEN 5-325 MG PO TABS
1.0000 | ORAL_TABLET | Freq: Once | ORAL | Status: DC
  Filled 2015-02-06: qty 1

## 2015-02-06 MED ORDER — IBUPROFEN 400 MG PO TABS: 400.0000 mg | ORAL_TABLET | Freq: Four times a day (QID) | ORAL | Status: DC | PRN

## 2015-02-06 NOTE — Discharge Instructions (Signed)
We saw you in the ER for THE BACK PAIN. All the results in the ER are normal, labs and imaging. We are not sure what is causing your symptoms, we suspect muscular pain, given that the pain is worse with movement and palpation in certain locations. The workup in the ER is not complete, and is limited to screening for life threatening and emergent conditions only, so please see a primary care doctor for further evaluation.  Please return to the ER if your symptoms worsen; you have increased pain, fevers, chills, inability to keep any medications down, confusion. Otherwise see the outpatient doctor as requested.   Abdominal Pain Many things can cause abdominal pain. Usually, abdominal pain is not caused by a disease and will improve without treatment. It can often be observed and treated at home. Your health care provider will do a physical exam and possibly order blood tests and X-rays to help determine the seriousness of your pain. However, in many cases, more time must pass before a clear cause of the pain can be found. Before that point, your health care provider may not know if you need more testing or further treatment. HOME CARE INSTRUCTIONS  Monitor your abdominal pain for any changes. The following actions may help to alleviate any discomfort you are experiencing:  Only take over-the-counter or prescription medicines as directed by your health care provider.  Do not take laxatives unless directed to do so by your health care provider.  Try a clear liquid diet (broth, tea, or water) as directed by your health care provider. Slowly move to a bland diet as tolerated. SEEK MEDICAL CARE IF:  You have unexplained abdominal pain.  You have abdominal pain associated with nausea or diarrhea.  You have pain when you urinate or have a bowel movement.  You experience abdominal pain that wakes you in the night.  You have abdominal pain that is worsened or improved by eating food.  You have  abdominal pain that is worsened with eating fatty foods.  You have a fever. SEEK IMMEDIATE MEDICAL CARE IF:   Your pain does not go away within 2 hours.  You keep throwing up (vomiting).  Your pain is felt only in portions of the abdomen, such as the right side or the left lower portion of the abdomen.  You pass bloody or black tarry stools. MAKE SURE YOU:  Understand these instructions.   Will watch your condition.   Will get help right away if you are not doing well or get worse.  Document Released: 06/29/2005 Document Revised: 09/24/2013 Document Reviewed: 05/29/2013 [facility] Patient Information 2015 [place], [place]. This information is not intended to replace advice given to you by your health care provider. Make sure you discuss any questions you have with your health care provider.

## 2015-02-06 NOTE — ED Notes (Signed)
Pt reports he started having left sided flank pain a couple of days ago and became worse this morning. Increased pain with movement. Denies any n/v or urinary symptoms.

## 2015-02-06 NOTE — ED Notes (Signed)
PA student at the bedside.  

## 2015-02-06 NOTE — ED Provider Notes (Signed)
CSN: [id number]     Arrival date & time 02/06/15  0910 History   First MD Initiated Contact with Patient 02/06/15 [phone]     Chief Complaint  Patient presents with  . Flank Pain     (Consider location/radiation/quality/duration/timing/severity/associated sxs/prior Treatment) HPI Comments: [age] year old male presents with left sided flank pain that began on Monday of insidious onset when patient was getting out of bed. PMH includes HTN. Pain is localized, sharp 10/10 with movement and movement of left arm and 2/10 with rest. Pain has worsened since Monday. Advil provides mild relief. Last BM was yesterday afternoon and was normal, no blood. No urinary symptoms. Is able to eat and drink like normal. Denies nausea, vomiting, diarrhea, constipation, cough, fever, rash, abdominal distention, shortness of breath, chest pain, scrotal swelling, testicular pain/swelling. No abdominal surgeries, no recent illness, no hx of hernias, no hx of pancreatitis.   Patient is a 69 y.o. male presenting with flank pain. The history is provided by the patient.  Flank Pain    Past Medical History  Diagnosis Date  . Hypertension    Past Surgical History  Procedure Laterality Date  . Total knee arthroplasty Left   . Cataract extraction     History reviewed. No pertinent family history. History  Substance Use Topics  . Smoking status: Never Smoker   . Smokeless tobacco: Never Used  . Alcohol Use: No    Review of Systems  Genitourinary: Positive for flank pain.  All other systems reviewed and are negative.     Allergies  Other and Prednisone  Home Medications   Prior to Admission medications   Medication Sig Start Date End Date Taking? Authorizing Provider  hydrALAZINE (APRESOLINE) 25 MG tablet Take 25 mg by mouth 2 (two) times daily. 09/01/14  Yes Historical Provider, MD  ibuprofen (ADVIL,MOTRIN) 200 MG tablet Take 600 mg by mouth every 6 (six) hours as needed for fever, headache or moderate pain.    Yes Historical Provider, MD  losartan-hydrochlorothiazide (HYZAAR) 100-25 MG per tablet Take 1 tablet by mouth daily. 09/01/14  Yes Historical Provider, MD  amoxicillin (AMOXIL) 500 MG capsule Take 1 capsule (500 mg total) by mouth 3 (three) times daily. Patient not taking: Reported on 02/06/2015 09/10/14   Mancel BaleElliott Wentz, MD  HYDROcodone-acetaminophen [facility]) 5-325 MG per tablet Take 1 tablet by mouth every 4 (four) hours as needed. Patient not taking: Reported on 02/06/2015 09/10/14   Mancel BaleElliott Wentz, MD   BP 197/77 mmHg  Pulse 46  Temp(Src) 98 F (36.7 C) (Oral)  Resp 22  SpO2 99% Physical Exam  Constitutional: He is oriented to person, place, and time. He appears well-developed.  HENT:  Head: Normocephalic and atraumatic.  Eyes: Conjunctivae and EOM are normal. Pupils are equal, round, and reactive to light.  Neck: Normal range of motion. Neck supple.  Cardiovascular: Normal rate and regular rhythm.   Pulmonary/Chest: Effort normal and breath sounds normal.  Abdominal: Soft. Bowel sounds are normal. He exhibits no distension and no mass. There is no tenderness. There is no rebound and no guarding.  Musculoskeletal:  LUQ tenderness, ad L flank tenderness  Neurological: He is alert and oriented to person, place, and time.  Skin: Skin is warm.  Nursing note and vitals reviewed.   ED Course  Procedures (including critical care time) Labs Review Labs Reviewed  URINE CULTURE  CBC WITH DIFFERENTIAL/PLATELET  BASIC METABOLIC PANEL  URINALYSIS, ROUTINE W REFLEX MICROSCOPIC    Imaging Review No results found.   EKG  Interpretation None      MDM   Final diagnoses:  Acute left flank pain    Pt with L sided pain. UA and Cr are normal. Pain is worse with palpation. Not pleuritic. Doesn't sound like renal stones. Also, pain worse with palpation, but no organomegaly. US shows no hydronephrosis, no tumors - so eel comfortable sending pt home.     Derwood KaplanAnkit Vidit Boissonneault, MD 02/06/15  1320

## 2015-02-06 NOTE — ED Notes (Signed)
States that he does not want the vicodin at this time.

## 2015-02-07 LAB — URINE CULTURE
COLONY COUNT: NO GROWTH
Culture: NO GROWTH

## 2015-06-12 DIAGNOSIS — M5416 Radiculopathy, lumbar region: Secondary | ICD-10-CM | POA: Insufficient documentation

## 2015-07-10 ENCOUNTER — Other Ambulatory Visit: Payer: Self-pay | Admitting: Neurosurgery

## 2015-07-10 DIAGNOSIS — M5416 Radiculopathy, lumbar region: Secondary | ICD-10-CM

## 2015-07-15 DIAGNOSIS — K1329 Other disturbances of oral epithelium, including tongue: Secondary | ICD-10-CM

## 2015-07-15 HISTORY — DX: Other disturbances of oral epithelium, including tongue: K13.29

## 2015-07-31 ENCOUNTER — Ambulatory Visit
Admission: RE | Admit: 2015-07-31 | Discharge: 2015-07-31 | Disposition: A | Discharge status: Home / Self Care | Payer: Commercial Managed Care - HMO | Source: Ambulatory Visit | Attending: Neurosurgery | Admitting: Neurosurgery

## 2015-07-31 DIAGNOSIS — M5416 Radiculopathy, lumbar region: Secondary | ICD-10-CM

## 2015-07-31 MED ORDER — IOHEXOL 180 MG/ML  SOLN
15.0000 mL | Freq: Once | INTRAMUSCULAR | Status: DC | PRN
  Administered 2015-07-31: 15 mL via INTRATHECAL

## 2015-07-31 MED ORDER — DIAZEPAM 5 MG PO TABS
5.0000 mg | ORAL_TABLET | Freq: Once | ORAL | Status: AC
  Administered 2015-07-31: 5 mg via ORAL

## 2015-07-31 NOTE — Discharge Instructions (Signed)

## 2015-11-13 ENCOUNTER — Encounter: Payer: Self-pay | Admitting: Neurology

## 2015-11-13 ENCOUNTER — Telehealth: Payer: Self-pay

## 2015-11-13 ENCOUNTER — Ambulatory Visit (INDEPENDENT_AMBULATORY_CARE_PROVIDER_SITE_OTHER): Payer: Commercial Managed Care - HMO | Admitting: Neurology

## 2015-11-13 VITALS — BP 168/77 | HR 46 | Ht 68.0 in | Wt 253.6 lb

## 2015-11-13 DIAGNOSIS — G4484 Primary exertional headache: Secondary | ICD-10-CM

## 2015-11-13 DIAGNOSIS — I10 Essential (primary) hypertension: Secondary | ICD-10-CM | POA: Diagnosis not present

## 2015-11-13 DIAGNOSIS — R51 Headache: Secondary | ICD-10-CM

## 2015-11-13 HISTORY — DX: Primary exertional headache: G44.84

## 2015-11-13 HISTORY — DX: Essential (primary) hypertension: I10

## 2015-11-13 MED ORDER — INDOMETHACIN 25 MG PO CAPS: 25.0000 mg | ORAL_CAPSULE | Freq: Three times a day (TID) | ORAL | Status: DC

## 2015-11-13 NOTE — Progress Notes (Signed)
NEUROLOGY CLINIC NEW PATIENT NOTE  NAME: Larry Haynes DOB: 02/25/1946 REFERRING PHYSICIAN: Paulina Fusi, MD  I saw Larry Haynes as a new consult in the neurovascular clinic today regarding  Chief Complaint  Patient presents with  . Referral    for Headaches, have increase over the past 6 months, from Dr. Foye Deer  .  HPI: Larry Haynes is a 70 y.o. male with PMH of HTN and arthritis who presents as a new patient for Exertional headache.    Patient stated that since 2014, he started to have exertional headache at bilateral forehead, aching pain, with nausea but no vomiting. It happens only when he had exertion for a while, for example, using chainsaw cutting trees, bending over for some time. During the headache, he will stop the physical activity, sat down, close eyes, head rest, the headache normal he goes away in 50 minutes. Denies any vision changes, dizziness, weakness, numbness, or LOC. However, it is not happening every time with exertion though.  On 09/10/2014, he had a fall, hitting on the left side head. CT showed significant fracture at left sinus bone and left facial bone. Patient reported LOC prior to the fall. Since that time, patient seems to have more often exertional headache than before. He did not seek for any medical treatment or use any medication for headache.  Since last year, patient seems to have more frequent exertional headache, almost every time with overexertion, he will have a headache, still at bilateral forehead, lasting 15 minutes after rest. Since last months, he stated that even without extensive exertion, he sometimes can feel headache coming on too. 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 Left vertebral artery slow flow versus occlusion and a remote lacunar infarct of posterior left cerebellar. Patient was sent here for neurological stimulation.  Patient had a history of  hypertension, not checked her blood pressure regularly at home, on hydralazine, losartan and HCTZ. Today blood pressure 168/77, patient stated that he did not take blood pressure this morning. He also had arthritis on ibuprofen when necessary. Denies any alcohol, cigarette, or illicit drugs. 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  Past Medical History  Diagnosis Date  . Hypertension   . Gout   . Bilateral hearing loss   . Hyperlipemia   . Headache    Past Surgical History  Procedure Laterality Date  . Total knee arthroplasty Left   . Cataract extraction     History reviewed. No pertinent family history. Current Outpatient Prescriptions  Medication Sig Dispense Refill  . amoxicillin (AMOXIL) 500 MG capsule Take 1 capsule (500 mg total) by mouth 3 (three) times daily. 21 capsule 0  . COLCRYS 0.6 MG tablet     . hydrALAZINE (APRESOLINE) 25 MG tablet Take 25 mg by mouth 2 (two) times daily.    Marland Kitchen ibuprofen (ADVIL,MOTRIN) 400 MG tablet Take 1 tablet (400 mg total) by mouth every 6 (six) hours as needed. 30 tablet 0  . losartan-hydrochlorothiazide (HYZAAR) 100-25 MG per tablet Take 1 tablet by mouth daily.    . indomethacin (INDOCIN) 25 MG capsule Take 1 capsule (25 mg total) by mouth 3 (three) times daily with meals. 90 capsule 2   No current facility-administered medications for this visit.   Allergies  Allergen Reactions  . Other Hives    Hot fudge  . Prednisone  Patient have increase energy   Social History   Social History  . Marital Status: Married    Spouse Name: N/A  . Number of Children: N/A  . Years of Education: N/A   Occupational History  . Not on file.   Social History Main Topics  . Smoking status: Never Smoker   . Smokeless tobacco: Never Used  . Alcohol Use: No  . Drug Use: No  . Sexual Activity: Not on file   Other Topics Concern  . Not on file   Social History Narrative     Review of Systems Full 14 system review of systems performed and notable only for those listed, all others are neg:  Constitutional:   Cardiovascular:  Ear/Nose/Throat:  Hearing loss Skin:  Eyes:   Respiratory:   Gastroitestinal:   Genitourinary:  Hematology/Lymphatic:   Endocrine:  Musculoskeletal:   Allergy/Immunology:   Neurological:   Psychiatric:  Sleep: Snoring   Physical Exam  Filed Vitals:   11/13/15 1119  BP: 168/77  Pulse: 46    General - Well nourished, well developed, in no apparent distress.  Ophthalmologic - bilateral fundi not visualized due to cataracts.  Cardiovascular - Regular rate and rhythm with no murmur. Carotid pulses were 2+ without bruits .   Neck - supple, no nuchal rigidity .  Mental Status -  Level of arousal and orientation to time, place, and person were intact. Language including expression, naming, repetition, comprehension, reading, and writing was assessed and found intact. Fund of Knowledge was assessed and was intact.  Cranial Nerves II - XII - II - Visual field intact OU. III, IV, VI - Extraocular movements intact. V - Facial sensation intact bilaterally. VII - Facial movement intact bilaterally. VIII - Hearing & vestibular intact bilaterally. X - Palate elevates symmetrically. XI - Chin turning & shoulder shrug intact bilaterally. XII - Tongue protrusion intact.  Motor Strength - The patient's strength was normal in all extremities and pronator drift was absent.  Bulk was normal and fasciculations were absent.   Motor Tone - Muscle tone was assessed at the neck and appendages and was normal.  Reflexes - The patient's reflexes were normal in all extremities and he had no pathological reflexes.  Sensory - Light touch, temperature/pinprick, vibration and proprioception, and Romberg testing were assessed and were normal.    Coordination - The patient had normal movements in the hands and feet with no ataxia or  dysmetria.  Tremor was absent.  Gait and Station - The patient's transfers, posture, gait, station, and turns were observed as normal.   Imaging  MRI brain without contrast 11/06/2015 (not able to see the MRI images, will request CD) 1. focal encephalomalacia of the right frontal operculum may be posttraumatic. 2. the moderate periventricular and subcortical white matter disease bilaterally as well as moderate generalized atrophy. The finding is nonspecific but can be seen in the setting of chronic microvascular ischemia, a demyelinating process such as multiple sclerosis, vasculitis, compensated migraine headache, or as the sequelae of the prior infection or inflammatory processes 3. Abnormal signal in the left vertebral artery suggesting slow flow or occlusion. 4. 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 Remote fracture of the posterior left maxillary sinus 6. No acute intracranial abnormality.  Lab Review none   Assessment and Plan:   In summary, Larry Haynes is a 70 y.o. male with PMH of HTN presents with exertional headache for 3 years, more frequent lately. HA bifrontal, mild to moderate,  exertion related, achy pain, resolves after rest. MRI rule out brain structural lesion. DDx including exertional HA, hypertensive HA, or vascular HA. Will like to do MRA head to rule out any AVM or aneurysm, and MRA neck to look for left VA abnormality. Encourage better BP control and start empiric treatment with low dose indomethacin. Will need request MRI CD to review the image.  - continue ASA for stroke prevention - will start indomethacin  tid for exertional headache prevention - will get the MRI images CD for review  - will order MRA head andn neck after review MRI brain as per pt request. - check BP at home daily and record and bring over to Dr. Veatrice Kells for medication adjustment if needed. - follow up in one month.  Thank you very much for the opportunity to  participate in the care of this patient.  Please do not hesitate to call if any questions or concerns arise.  No orders of the defined types were placed in this encounter.    Meds ordered this encounter  Medications  . COLCRYS 0.6 MG tablet    Sig:   . indomethacin (INDOCIN) 25 MG capsule    Sig: Take 1 capsule (25 mg total) by mouth 3 (three) times daily with meals.    Dispense:  90 capsule    Refill:  2    Patient Instructions  - continue ASA for stroke prevention - will start indomethacin  three time a day for exertional headache prevention - will get the MRI images from imaging place.  - will order MRI vessel after review MRI brain. - check BP at home daily and record and bring over to Dr. Veatrice Kells for medication adjustment if needed. - follow up in one month.   Marvel Plan, MD PhD [facility] [address] [place], [place] [address] [phone]

## 2015-11-13 NOTE — Telephone Encounter (Signed)
LFt vm at [facility] office about needing to know where did patient have MRI done. Rn call [facility], and they dont have patient on file.

## 2015-11-13 NOTE — Patient Instructions (Signed)
-   continue ASA for stroke prevention - will start indomethacin  three time a day for exertional headache prevention - will get the MRI images from imaging place.  - will order MRI vessel after review MRI brain. - check BP at home daily and record and bring over to Dr. Veatrice Kells for medication adjustment if needed. - follow up in one month.

## 2015-11-19 NOTE — Telephone Encounter (Signed)
Will call [place] to get copies of MRI for patient.

## 2015-11-19 NOTE — Telephone Encounter (Signed)
Tammy with Dr. Patric Dykes office called stating the patient had an MRI head w/o contrast on 11-06-15 at [facility] in [place]. If there are any more questions you can reach Tammy at [phone].

## 2015-11-24 NOTE — Telephone Encounter (Signed)
Rn call [facility] at [phone] about getting copy of MRI disc and report. Rn talk to Nicole Cella in imaging and she request a fax be sent to [phone].

## 2015-12-12 IMAGING — CT CT HEAD W/O CM
3 of 6 series · 13 of 47 positions shown, 15 images · non-contrast
Comparison: None.

CLINICAL DATA: Left-sided facial pain after fall off logging
equipment. Positive loss of consciousness.

EXAM:
CT HEAD WITHOUT CONTRAST
CT CERVICAL SPINE WITHOUT CONTRAST
TECHNIQUE: Multidetector CT imaging of the head and cervical spine was
performed following the standard protocol without intravenous
contrast. Multiplanar CT image reconstructions of the cervical spine
were also generated.

[Series 3: head 2.0 h70h · axial · 0.43mm/px · z∈[+1190,+1218]mm · 2 of 83 slices shown]
[im 14/83  brain]
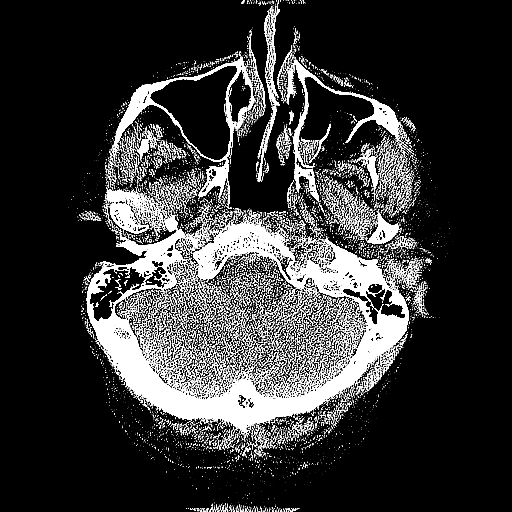
[im 28/83  brain]
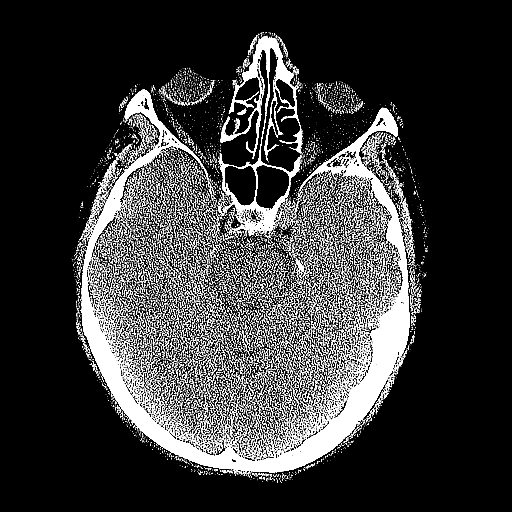

[Series 7: coronals · coronal · 0.24mm/px · 3 of 52 slices shown]
[im 18/52  brain]
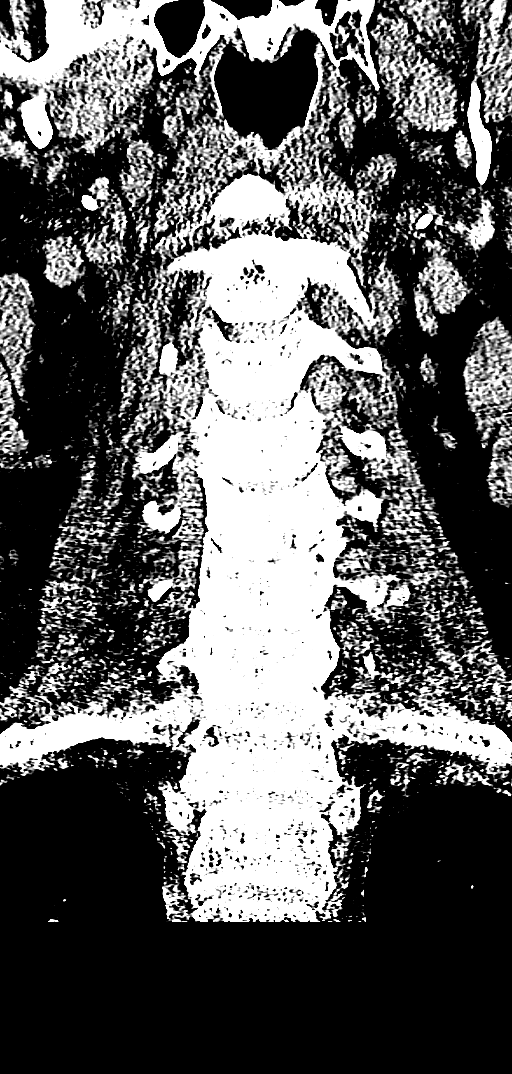
[im 23/52  brain]
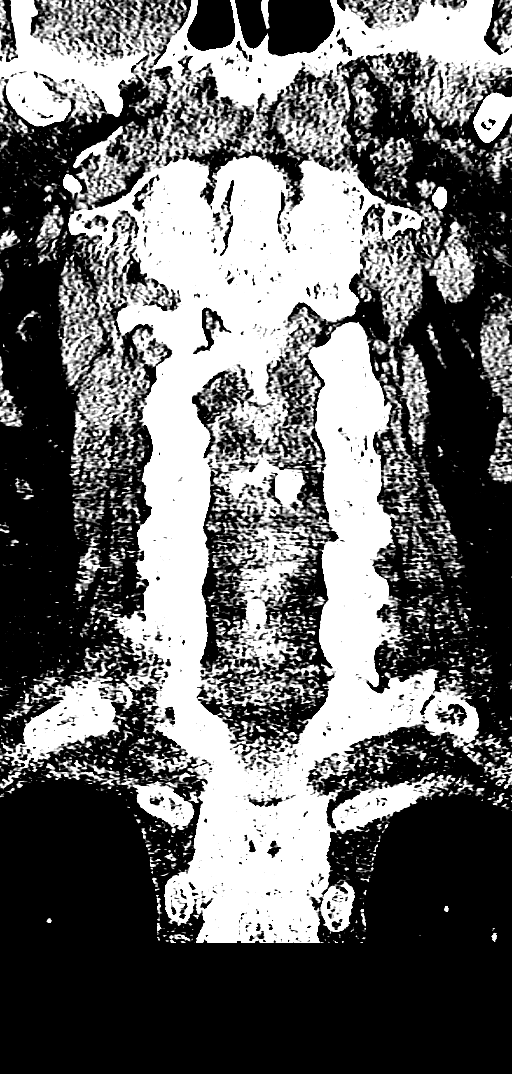
[im 29/52  brain]
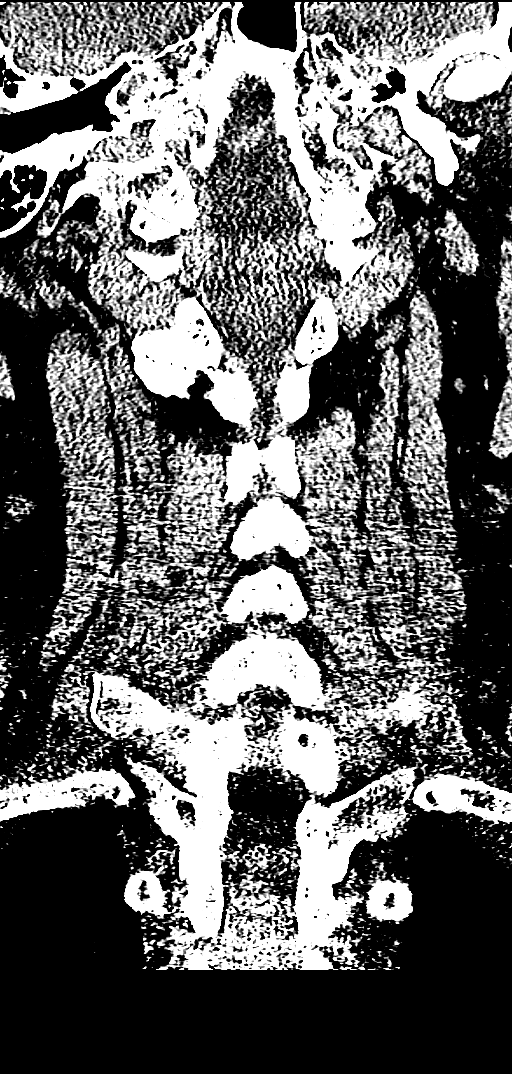

[Series 9: orthogonals · axial · 0.21mm/px · z∈[+975,+1130]mm · 8 of 114 slices shown, 10 images]
[im 13/114  brain]
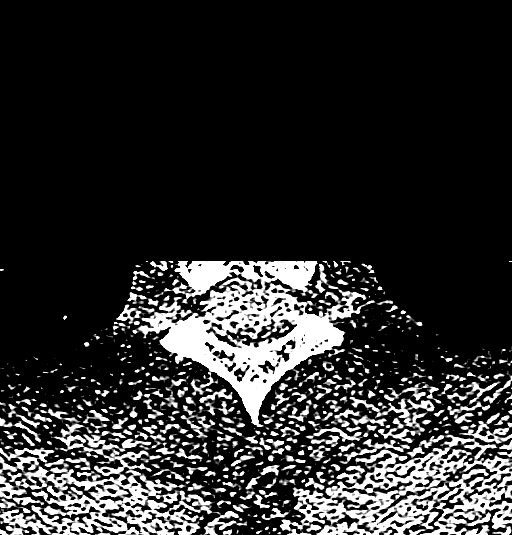
[im 13/114  bone]
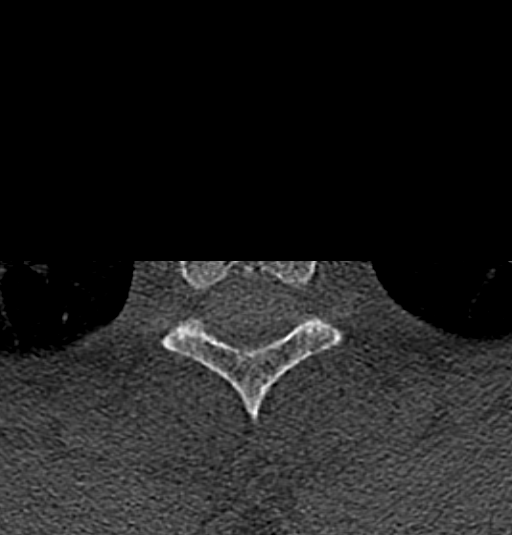
[im 26/114  brain]
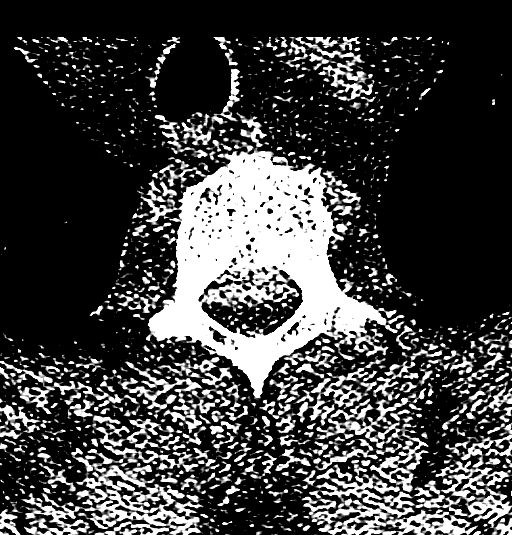
[im 38/114  brain]
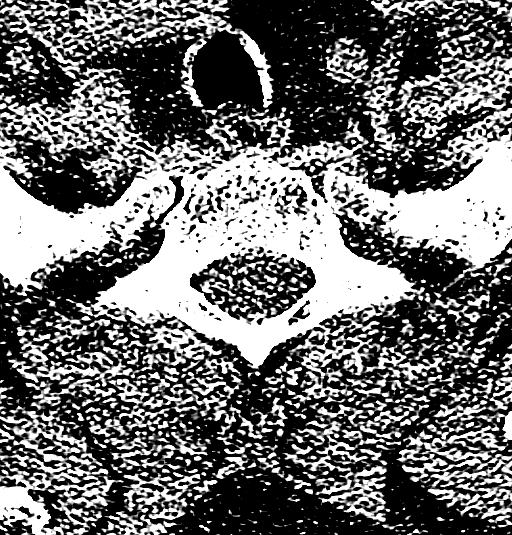
[im 51/114  brain]
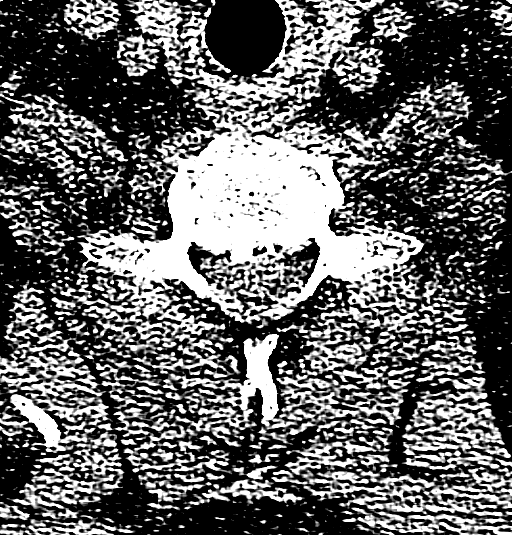
[im 63/114  brain]
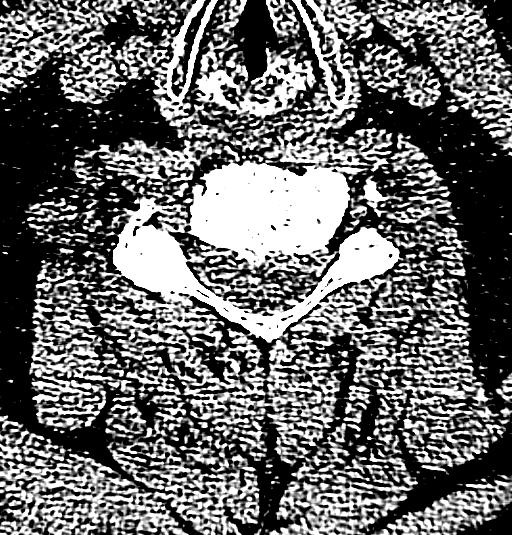
[im 63/114  bone]
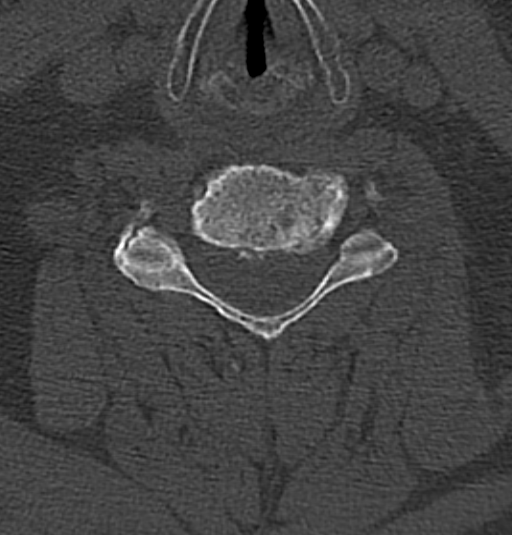
[im 76/114  brain]
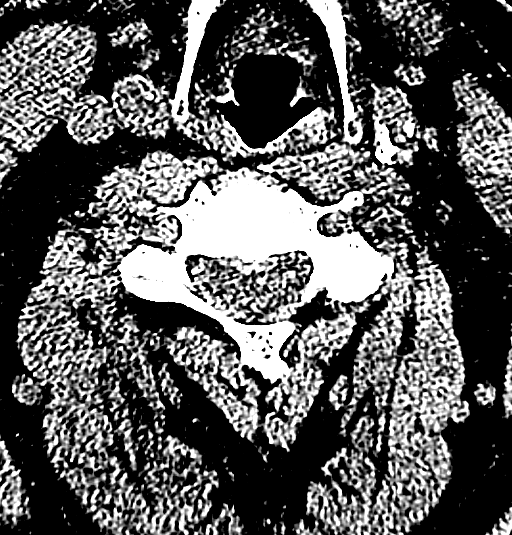
[im 88/114  brain]
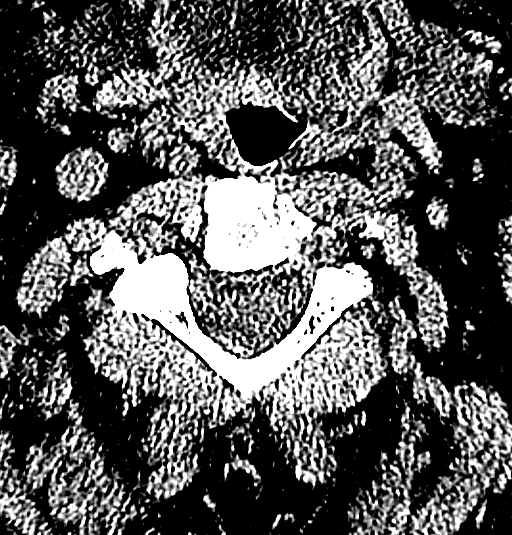
[im 101/114  brain]
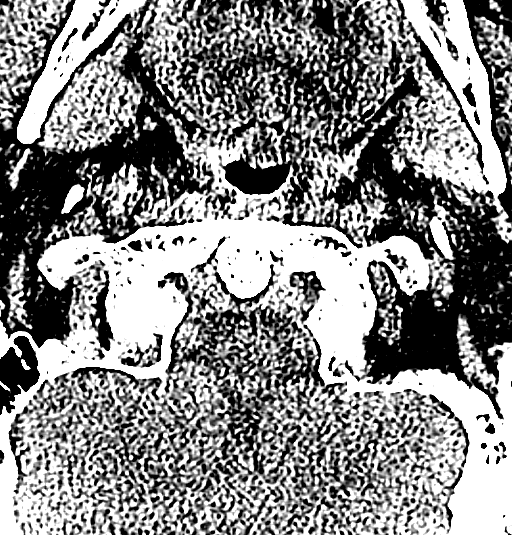

[13 of 47 positions shown; findings below may reference images not displayed]

FINDINGS: CT HEAD FINDINGS

Bony calvarium appears intact. Moderately depressed fracture
involving the left lateral maxillary wall is noted with hemorrhage
present in the left maxillary sinus. Pterygoid plates appear normal.
No mass effect or midline shift is noted. Ventricular size is within
normal limits. There is no evidence of mass lesion, intracranial
hemorrhage or acute infarction.

CT CERVICAL SPINE FINDINGS

No fracture or spondylolisthesis is noted. Severe degenerative disc
disease is noted at C4-5, C5-6 and C6-7 with anterior and posterior
osteophyte formation. Minimal hypertrophy of posterior facet joints
is noted. Visualized portions of upper lung fields appear normal.
IMPRESSION: No gross intracranial abnormality is noted.

Moderately depressed fracture is seen involving the left lateral
maxillary wall with hemorrhage present within the left maxillary
sinus.

Multilevel degenerative disc disease is seen in the cervical spine.
No acute abnormality is noted.

## 2016-01-08 ENCOUNTER — Ambulatory Visit: Payer: Commercial Managed Care - HMO | Admitting: Neurology

## 2016-01-20 ENCOUNTER — Encounter (HOSPITAL_COMMUNITY): Payer: Self-pay | Admitting: Emergency Medicine

## 2016-01-20 ENCOUNTER — Emergency Department (HOSPITAL_COMMUNITY)
Admission: EM | Admit: 2016-01-20 | Discharge: 2016-01-20 | Disposition: A | Discharge status: Home / Self Care | Payer: Commercial Managed Care - HMO | Attending: Emergency Medicine | Admitting: Emergency Medicine

## 2016-01-20 ENCOUNTER — Emergency Department (HOSPITAL_COMMUNITY): Payer: Commercial Managed Care - HMO

## 2016-01-20 DIAGNOSIS — Z79899 Other long term (current) drug therapy: Secondary | ICD-10-CM | POA: Diagnosis not present

## 2016-01-20 DIAGNOSIS — E669 Obesity, unspecified: Secondary | ICD-10-CM | POA: Insufficient documentation

## 2016-01-20 DIAGNOSIS — I10 Essential (primary) hypertension: Secondary | ICD-10-CM | POA: Diagnosis not present

## 2016-01-20 DIAGNOSIS — R059 Cough, unspecified: Secondary | ICD-10-CM

## 2016-01-20 DIAGNOSIS — Z7984 Long term (current) use of oral hypoglycemic drugs: Secondary | ICD-10-CM | POA: Insufficient documentation

## 2016-01-20 DIAGNOSIS — R600 Localized edema: Secondary | ICD-10-CM | POA: Insufficient documentation

## 2016-01-20 DIAGNOSIS — R05 Cough: Secondary | ICD-10-CM | POA: Diagnosis present

## 2016-01-20 DIAGNOSIS — Z8739 Personal history of other diseases of the musculoskeletal system and connective tissue: Secondary | ICD-10-CM | POA: Insufficient documentation

## 2016-01-20 DIAGNOSIS — Z7952 Long term (current) use of systemic steroids: Secondary | ICD-10-CM | POA: Diagnosis not present

## 2016-01-20 DIAGNOSIS — H9193 Unspecified hearing loss, bilateral: Secondary | ICD-10-CM | POA: Insufficient documentation

## 2016-01-20 DIAGNOSIS — J9801 Acute bronchospasm: Secondary | ICD-10-CM | POA: Diagnosis not present

## 2016-01-20 MED ORDER — LORATADINE 10 MG PO TABS
10.0000 mg | ORAL_TABLET | Freq: Every day | ORAL | Status: DC
  Administered 2016-01-20: 10 mg via ORAL
  Filled 2016-01-20: qty 1

## 2016-01-20 MED ORDER — ACETAMINOPHEN 325 MG PO TABS
ORAL_TABLET | ORAL | Status: AC
  Administered 2016-01-20: 650 mg via ORAL
  Filled 2016-01-20: qty 2

## 2016-01-20 MED ORDER — ALBUTEROL SULFATE (2.5 MG/3ML) 0.083% IN NEBU
5.0000 mg | INHALATION_SOLUTION | Freq: Once | RESPIRATORY_TRACT | Status: AC
Start: 2016-01-20 — End: 2016-01-20
  Administered 2016-01-20: 5 mg via RESPIRATORY_TRACT
  Filled 2016-01-20: qty 6

## 2016-01-20 MED ORDER — ACETAMINOPHEN 325 MG PO TABS
650.0000 mg | ORAL_TABLET | Freq: Once | ORAL | Status: AC | PRN
  Administered 2016-01-20: 650 mg via ORAL

## 2016-01-20 MED ORDER — CETIRIZINE-PSEUDOEPHEDRINE ER 5-120 MG PO TB12: 1.0000 | ORAL_TABLET | Freq: Every day | ORAL | Status: DC

## 2016-01-20 MED ORDER — BENZONATATE 100 MG PO CAPS: 100.0000 mg | ORAL_CAPSULE | Freq: Three times a day (TID) | ORAL | Status: DC

## 2016-01-20 MED ORDER — IPRATROPIUM BROMIDE 0.02 % IN SOLN
0.5000 mg | Freq: Once | RESPIRATORY_TRACT | Status: AC
  Administered 2016-01-20: 0.5 mg via RESPIRATORY_TRACT
  Filled 2016-01-20: qty 2.5

## 2016-01-20 MED ORDER — CETIRIZINE HCL 10 MG PO TABS
10.0000 mg | ORAL_TABLET | Freq: Every day | ORAL | Status: DC
Start: 2016-01-20 — End: 2017-08-29

## 2016-01-20 MED ORDER — ALBUTEROL SULFATE HFA 108 (90 BASE) MCG/ACT IN AERS
2.0000 | INHALATION_SPRAY | RESPIRATORY_TRACT | Status: DC | PRN
Start: 2016-01-20 — End: 2016-01-21
  Administered 2016-01-20: 2 via RESPIRATORY_TRACT
  Filled 2016-01-20: qty 6.7

## 2016-01-20 MED ORDER — AEROCHAMBER PLUS FLO-VU MEDIUM MISC
1.0000 | Freq: Once | Status: AC
  Administered 2016-01-20: 1
  Filled 2016-01-20: qty 1

## 2016-01-20 NOTE — ED Provider Notes (Signed)
CSN: [id number]     Arrival date & time 01/20/16  1528 History   First MD Initiated Contact with Patient 01/20/16 1759     Chief Complaint  Patient presents with  . Cough     (Consider location/radiation/quality/duration/timing/severity/associated sxs/prior Treatment) The history is provided by the patient, medical records and the spouse. No language interpreter was used.     Larry Haynes is a 70 y.o. male  with a hx of HTN, Gout, hyperlipidemia presents to the Emergency Department complaining of gradual, persistent, nonproductive cough onset 5 weeks ago. Pt reports at the beginning of the cough he and his wife were sick with a URI.  He reports he has seen his PCP x3 and Monday was given Albuterol treatment in the office and d/c home with prednisone and doxycycline.  He was also given an Rx for cough medication with codeine which didn't help.  Pt reports he has never been a smoker.  He reports the cough is almost like a spam with 4-5 coughing fits per hour.  He reports his "lungs are fine."  He does report mild SOB at night while laying on his back, but this is alleviated by rolling over onto his side.  He reports intermittent low grade fevers to 100.0 at home without specific treatments.  Pt denies statin usage.  Pt reports that sometimes if he drinks some water he gets a little better.  Nothing makes it worse.  Pt denies seasonal allergies.  Denies seasonal allergies.   He is a logger by trade, but doesn't work in the words.  Pt reports no recent surgeries.  Pt denies leg swelling, chest pain, hx of DVT, abd pain, N/V/D, weakness, syncope, headache, neck pain, sore throat, rhinorrhea, dyspnea on exertion.  Pt isn't sure about post nasal drip.  Pt reports recurrent bronchitis with URI infections over the years.     Past Medical History  Diagnosis Date  . Hypertension   . Gout   . Bilateral hearing loss   . Hyperlipemia   . Headache    Past Surgical History  Procedure Laterality Date  .  Total knee arthroplasty Left   . Cataract extraction     History reviewed. No pertinent family history. Social History  Substance Use Topics  . Smoking status: Never Smoker   . Smokeless tobacco: Never Used  . Alcohol Use: No    Review of Systems  Constitutional: Negative for fever, diaphoresis, appetite change, fatigue and unexpected weight change.  HENT: Negative for congestion, mouth sores, rhinorrhea, sinus pressure, sneezing, sore throat and trouble swallowing.   Eyes: Negative for visual disturbance.  Respiratory: Positive for cough, chest tightness (at night), shortness of breath (only at night) and wheezing ( resolved several weeks ago).   Cardiovascular: Negative for chest pain.  Gastrointestinal: Negative for nausea, vomiting, abdominal pain, diarrhea and constipation.  Endocrine: Negative for polydipsia, polyphagia and polyuria.  Genitourinary: Negative for dysuria, urgency, frequency and hematuria.  Musculoskeletal: Negative for back pain and neck stiffness.  Skin: Negative for rash.  Allergic/Immunologic: Negative for immunocompromised state.  Neurological: Negative for syncope, light-headedness and headaches.  Hematological: Does not bruise/bleed easily.  Psychiatric/Behavioral: Negative for sleep disturbance. The patient is not nervous/anxious.       Allergies  Other and Prednisone  Home Medications   Prior to Admission medications   Medication Sig Start Date End Date Taking? Authorizing Provider  doxycycline (VIBRAMYCIN) 100 MG capsule Take 100 mg by mouth 2 (two) times daily. 01/19/16  Yes  Historical Provider, MD  hydrALAZINE (APRESOLINE) 25 MG tablet Take 25 mg by mouth 2 (two) times daily. 09/01/14  Yes Historical Provider, MD  losartan-hydrochlorothiazide (HYZAAR) 100-25 MG per tablet Take 1 tablet by mouth daily. 09/01/14  Yes Historical Provider, MD  predniSONE (DELTASONE) 20 MG tablet Take 20 mg by mouth every morning. 01/18/16  Yes Historical Provider, MD   benzonatate (TESSALON) 100 MG capsule Take 1 capsule (100 mg total) by mouth every 8 (eight) hours. 01/20/16   Hutchinson Isenberg, PA-C  cetirizine (ZYRTEC ALLERGY) 10 MG tablet Take 1 tablet (10 mg total) by mouth daily. 01/20/16   Taj Nevins, PA-C   BP 161/86 mmHg  Pulse 88  Temp(Src) 100 F (37.8 C) (Oral)  Resp 18  SpO2 95% Physical Exam  Constitutional: He is oriented to person, place, and time. He appears well-developed and well-nourished. No distress.  Obese male  HENT:  Head: Normocephalic and atraumatic.  Right Ear: Tympanic membrane, external ear and ear canal normal.  Left Ear: Tympanic membrane, external ear and ear canal normal.  Nose: No mucosal edema or rhinorrhea. No epistaxis. Right sinus exhibits no maxillary sinus tenderness and no frontal sinus tenderness. Left sinus exhibits no maxillary sinus tenderness and no frontal sinus tenderness.  Mouth/Throat: Uvula is midline, oropharynx is clear and moist and mucous membranes are normal. Mucous membranes are not pale and not cyanotic. No oropharyngeal exudate, posterior oropharyngeal edema, posterior oropharyngeal erythema or tonsillar abscesses.  Eyes: Conjunctivae are normal. Pupils are equal, round, and reactive to light.  Neck: Normal range of motion and full passive range of motion without pain.  Cardiovascular: Normal rate, normal heart sounds and intact distal pulses.   Pulmonary/Chest: Effort normal. No stridor. He has wheezes (mild, expiratory).  Diffuse expiratory wheeze throughout Dry hacking cough  Abdominal: Soft. Bowel sounds are normal. He exhibits no distension. There is no tenderness.  Musculoskeletal: Normal range of motion. He exhibits edema (trace, nonpitting, bilateral).  Lymphadenopathy:    He has no cervical adenopathy.  Neurological: He is alert and oriented to person, place, and time.  Skin: Skin is warm and dry. No rash noted. He is not diaphoretic.  Psychiatric: He has a normal mood and  affect.  Nursing note and vitals reviewed.   ED Course  Procedures (including critical care time)  Imaging Review Dg Chest 2 View  01/20/2016  CLINICAL DATA:  70 year old male with nonproductive cough for 5 weeks and intermittent fever. Initial encounter. EXAM: CHEST  2 VIEW COMPARISON:  Chest CT 09/10/2014, and earlier. FINDINGS: Lung volumes are stable and at the lower limits of normal. Stable cardiac size at the upper limits of normal. Other mediastinal contours are within normal limits. Visualized tracheal air column is within normal limits. No pneumothorax, pulmonary edema, pleural effusion or confluent pulmonary opacity. No acute osseous abnormality identified. IMPRESSION: No acute cardiopulmonary abnormality. Electronically Signed   By: Odessa Fleming M.D.   On: 01/20/2016 16:03   I have personally reviewed and evaluated these images and lab results as part of my medical decision-making.   MDM   Final diagnoses:  Cough  Bronchospasm   Dushawn G Melgoza present with persistent cough x 4 weeks.  Pt appears uncomfortable with persistent coughing throughout hx and PE.  Wheezing noted.  Suspect bronchospasm secondary to bronchitis. No evidence of pulmonary edema on chest x-ray, trace nonpitting edema of the lower extremities bilaterally. No evidence of DVT. No tachycardia. Highly doubt PE.  No recent travel to suspect tuberculosis.  Patient  is not taking an ACE inhibitor that he is taking an ARB.  Patient reports no changes in medications over the last 6-12 weeks.  Oxygen saturation 95% on room air upon arrival.  Pt given albuterol inhaler with improvement in both cough and wheezing.  Pt ambulated in the ED with oxygen saturation 95-96% on room air and without increased work of breathing.  He wishes for discharge home.   Recommend f/u with PCP for further evaluation.    BP 126/98 mmHg  Pulse 90  Temp(Src) 100 F (37.8 C) (Oral)  Resp 18  SpO2 94%    Dierdre Forth, PA-C 01/21/16  0135  Eber Hong, MD 01/21/16 [phone]

## 2016-01-20 NOTE — ED Provider Notes (Signed)
The patient is a 70 year old male, he is outside during the day, he has had approximately 5 weeks of coughing, states that he started with an upper respiratory infection but has progressed to have a persistent cough which has not improved despite taking cough medicine containing codeine, albuterol treatment at the doctor's office, prednisone and antibiotics including the most recent treatment with doxycycline which she is currently taking. He does not take any ACE inhibitor's, he does not have any leg swelling, on exam the patient does have diffuse mild X Torrey wheezing, diminished lung sounds, he is unable to speak without coughing, his oxygen saturation is 95% on room air at rest. There is no rales, no JVD  Chest x-ray reveals no signs of infiltrates, this does not appear to be consistent with congestive heart failure, it does appear consistent with a prolonged bronchitis date given his low-grade temperature and prodromal upper respiratory infection. Anticipate discharge to follow-up in the outpatient setting with albuterol metered-dose inhaler with a spacer, respiratory will given teach prior to discharge, finished steroids, finish doxycycline, start antihistamine and take consistently for one month, stay indoors for over 1 week with minimal talking, patient is in agreement with the plan  Medical screening examination/treatment/procedure(s) were conducted as a shared visit with non-physician practitioner(s) and myself.  I personally evaluated the patient during the encounter.  Clinical Impression:   Final diagnoses:  Cough  Bronchospasm     I have personally viewed and interpreted the imaging and agree with radiologist interpretation.     Eber HongBrian Saraya Tirey, MD 01/21/16 [phone]

## 2016-01-20 NOTE — ED Notes (Signed)
Pt sts cough x 4 weeks and sts non productive cough and no meds are helping

## 2016-01-20 NOTE — Discharge Instructions (Signed)
1. Medications: albuterol MDI for coughing fits or feelings of SOB, continue prednisone and antibiotic, usual home medications 2. Treatment: rest, drink plenty of fluids, begin OTC antihistamine (Zyrtec); stay indoors for 1 week  3. Follow Up: Please followup with your primary doctor in 2-3 days for discussion of your diagnoses and further evaluation after today's visit; if you do not have a primary care doctor use the resource guide provided to find one; Please return to the ER for difficulty breathing, high fevers or worsening symptoms.   Cough, Adult Coughing is a reflex that clears your throat and your airways. Coughing helps to heal and protect your lungs. It is normal to cough occasionally, but a cough that happens with other symptoms or lasts a long time may be a sign of a condition that needs treatment. A cough may last only 2-3 weeks (acute), or it may last longer than 8 weeks (chronic). CAUSES Coughing is commonly caused by:  Breathing in substances that irritate your lungs.  A viral or bacterial respiratory infection.  Allergies.  Asthma.  Postnasal drip.  Smoking.  Acid backing up from the stomach into the esophagus (gastroesophageal reflux).  Certain medicines.  Chronic lung problems, including COPD (or rarely, lung cancer).  Other medical conditions such as heart failure. HOME CARE INSTRUCTIONS  Pay attention to any changes in your symptoms. Take these actions to help with your discomfort:  Take medicines only as told by your health care provider.  If you were prescribed an antibiotic medicine, take it as told by your health care provider. Do not stop taking the antibiotic even if you start to feel better.  Talk with your health care provider before you take a cough suppressant medicine.  Drink enough fluid to keep your urine clear or pale yellow.  If the air is dry, use a cold steam vaporizer or humidifier in your bedroom or your home to help loosen  secretions.  Avoid anything that causes you to cough at work or at home.  If your cough is worse at night, try sleeping in a semi-upright position.  Avoid cigarette smoke. If you smoke, quit smoking. If you need help quitting, ask your health care provider.  Avoid caffeine.  Avoid alcohol.  Rest as needed. SEEK MEDICAL CARE IF:   You have new symptoms.  You cough up pus.  Your cough does not get better after 2-3 weeks, or your cough gets worse.  You cannot control your cough with suppressant medicines and you are losing sleep.  You develop pain that is getting worse or pain that is not controlled with pain medicines.  You have a fever.  You have unexplained weight loss.  You have night sweats. SEEK IMMEDIATE MEDICAL CARE IF:  You cough up blood.  You have difficulty breathing.  Your heartbeat is very fast.   This information is not intended to replace advice given to you by your health care provider. Make sure you discuss any questions you have with your health care provider.   Document Released: 03/18/2011 Document Revised: 06/10/2015 Document Reviewed: 11/26/2014 Elsevier Interactive Patient Education [employer]2016 Elsevier Inc.

## 2016-01-20 NOTE — ED Notes (Signed)
Ambulated pt with pulse ox % stayed from 95-96%

## 2017-07-20 ENCOUNTER — Other Ambulatory Visit: Payer: Self-pay | Admitting: Neurosurgery

## 2017-08-22 ENCOUNTER — Other Ambulatory Visit: Payer: Self-pay

## 2017-08-22 ENCOUNTER — Encounter (HOSPITAL_COMMUNITY): Payer: Self-pay

## 2017-08-22 ENCOUNTER — Encounter (HOSPITAL_COMMUNITY)
Admission: RE | Admit: 2017-08-22 | Discharge: 2017-08-22 | Disposition: A | Discharge status: Home / Self Care | Payer: Medicare PPO | Source: Ambulatory Visit | Attending: Neurosurgery | Admitting: Neurosurgery

## 2017-08-22 DIAGNOSIS — R001 Bradycardia, unspecified: Secondary | ICD-10-CM | POA: Diagnosis not present

## 2017-08-22 DIAGNOSIS — E785 Hyperlipidemia, unspecified: Secondary | ICD-10-CM | POA: Diagnosis not present

## 2017-08-22 DIAGNOSIS — Z01818 Encounter for other preprocedural examination: Secondary | ICD-10-CM | POA: Insufficient documentation

## 2017-08-22 DIAGNOSIS — Z0183 Encounter for blood typing: Secondary | ICD-10-CM | POA: Insufficient documentation

## 2017-08-22 DIAGNOSIS — H9193 Unspecified hearing loss, bilateral: Secondary | ICD-10-CM | POA: Diagnosis not present

## 2017-08-22 DIAGNOSIS — Z79899 Other long term (current) drug therapy: Secondary | ICD-10-CM | POA: Diagnosis not present

## 2017-08-22 DIAGNOSIS — I440 Atrioventricular block, first degree: Secondary | ICD-10-CM | POA: Diagnosis not present

## 2017-08-22 DIAGNOSIS — Z01812 Encounter for preprocedural laboratory examination: Secondary | ICD-10-CM | POA: Insufficient documentation

## 2017-08-22 DIAGNOSIS — I10 Essential (primary) hypertension: Secondary | ICD-10-CM | POA: Diagnosis not present

## 2017-08-22 HISTORY — DX: Unspecified osteoarthritis, unspecified site: M19.90

## 2017-08-22 LAB — CBC WITH DIFFERENTIAL/PLATELET
BASOS ABS: 0.1 10*3/uL (ref 0.0–0.1)
BASOS PCT: 1 %
Eosinophils Absolute: 0.3 10*3/uL (ref 0.0–0.7)
Eosinophils Relative: 3 %
HEMATOCRIT: 46.6 % (ref 39.0–52.0)
HEMOGLOBIN: 16.2 g/dL (ref 13.0–17.0)
Lymphocytes Relative: 25 %
Lymphs Abs: 2.9 10*3/uL (ref 0.7–4.0)
MCH: 32.3 pg (ref 26.0–34.0)
MCHC: 34.8 g/dL (ref 30.0–36.0)
MCV: 92.8 fL (ref 78.0–100.0)
MONO ABS: 1.2 10*3/uL — AB (ref 0.1–1.0)
Monocytes Relative: 10 %
NEUTROS ABS: 6.9 10*3/uL (ref 1.7–7.7)
NEUTROS PCT: 61 %
Platelets: 228 10*3/uL (ref 150–400)
RBC: 5.02 MIL/uL (ref 4.22–5.81)
RDW: 13.4 % (ref 11.5–15.5)
WBC: 11.3 10*3/uL — AB (ref 4.0–10.5)

## 2017-08-22 LAB — BASIC METABOLIC PANEL
Anion gap: 10 (ref 5–15)
BUN: 14 mg/dL (ref 6–20)
CALCIUM: 9.1 mg/dL (ref 8.9–10.3)
CO2: 25 mmol/L (ref 22–32)
Chloride: 104 mmol/L (ref 101–111)
Creatinine, Ser: 1.08 mg/dL (ref 0.61–1.24)
Glucose, Bld: 121 mg/dL — ABNORMAL HIGH (ref 65–99)
Potassium: 4.7 mmol/L (ref 3.5–5.1)
Sodium: 139 mmol/L (ref 135–145)

## 2017-08-22 LAB — TYPE AND SCREEN
ABO/RH(D): A POS
ANTIBODY SCREEN: NEGATIVE

## 2017-08-22 LAB — SURGICAL PCR SCREEN
MRSA, PCR: NEGATIVE
STAPHYLOCOCCUS AUREUS: NEGATIVE

## 2017-08-22 NOTE — Progress Notes (Signed)
   08/22/17 1544  OBSTRUCTIVE SLEEP APNEA  Have you ever been diagnosed with sleep apnea through a sleep study? No  Do you snore loudly (loud enough to be heard through closed doors)?  1  Do you often feel tired, fatigued, or sleepy during the daytime (such as falling asleep during driving or talking to someone)? 0  Has anyone observed you stop breathing during your sleep? 0  Do you have, or are you being treated for high blood pressure? 1  BMI more than 35 kg/m2? 1  Age > 50 (1-yes) 1  Neck circumference greater than:Male 16 inches or larger, Male 17inches or larger? 1  Male Gender (Yes=1) 1  Obstructive Sleep Apnea Score 6  Score 5 or greater  Results sent to PCP

## 2017-08-22 NOTE — Pre-Procedure Instructions (Signed)
Larry HighlandJohnie G Haynes  08/22/2017      [facility] - [place], [place] - [address] [facility] [place] [address] Phone: [phone] Fax: [phone]    Your procedure is scheduled on Tuesday 08/29/17  [facility] at 600 A.M.  Call this number if you have problems the morning of surgery:  [phone]   Remember:  Do not eat food or drink liquids after midnight.  Take these medicines the morning of surgery with A SIP OF WATER - AMLODIPINE (NORVASC)  7 days prior to surgery STOP taking any Aspirin(unless otherwise instructed by your surgeon), Aleve, Naproxen, Ibuprofen, Motrin, Advil, Goody's, BC's, all herbal medications, fish oil, and all vitamins   Do not wear jewelry, make-up or nail polish.  Do not wear lotions, powders, or perfumes, or deoderant.  Do not shave 48 hours prior to surgery.  Men may shave face and neck.  Do not bring valuables to the hospital.  [facility] is not responsible for any belongings or valuables.  Contacts, dentures or bridgework may not be worn into surgery.  Leave your suitcase in the car.  After surgery it may be brought to your room.  For patients admitted to the hospital, discharge time will be determined by your treatment team.  Patients discharged the day of surgery will not be allowed to drive home.   Name and phone number of your driver:    Special instructions:  [place] - Preparing for Surgery  Before surgery, you can play an important role.  Because skin is not sterile, your skin needs to be as free of germs as possible.  You can reduce the number of germs on you skin by washing with CHG (chlorahexidine gluconate) soap before surgery.  CHG is an antiseptic cleaner which kills germs and bonds with the skin to continue killing germs even after washing.  Please DO NOT use if you have an allergy to CHG or antibacterial soaps.  If your skin becomes reddened/irritated stop using the  CHG and inform your nurse when you arrive at Short Stay.  Do not shave (including legs and underarms) for at least 48 hours prior to the first CHG shower.  You may shave your face.  Please follow these instructions carefully:   1.  Shower with CHG Soap the night before surgery and the                                morning of Surgery.  2.  If you choose to wash your hair, wash your hair first as usual with your       normal shampoo.  3.  After you shampoo, rinse your hair and body thoroughly to remove the                      Shampoo.  4.  Use CHG as you would any other liquid soap.  You can apply chg directly       to the skin and wash gently with scrungie or a clean washcloth.  5.  Apply the CHG Soap to your body ONLY FROM THE NECK DOWN.        Do not use on open wounds or open sores.  Avoid contact with your eyes,       ears, mouth and genitals (private parts).  Wash genitals (private parts)  with your normal soap.  6.  Wash thoroughly, paying special attention to the area where your surgery        will be performed.  7.  Thoroughly rinse your body with warm water from the neck down.  8.  DO NOT shower/wash with your normal soap after using and rinsing off       the CHG Soap.  9.  Pat yourself dry with a clean towel.            10.  Wear clean pajamas.            11.  Place clean sheets on your bed the night of your first shower and do not        sleep with pets.  Day of Surgery  Do not apply any lotions/deoderants the morning of surgery.  Please wear clean clothes to the hospital/surgery center.    Please read over the following fact sheets that you were given. MRSA Information and Surgical Site Infection Prevention

## 2017-08-28 NOTE — Anesthesia Preprocedure Evaluation (Addendum)
Anesthesia Evaluation  Patient identified by MRN, date of birth, ID band Patient awake    Reviewed: Allergy & Precautions, H&P , NPO status , Patient's Chart, lab work & pertinent test results  Airway Mallampati: III  TM Distance: >3 FB Neck ROM: Full    Dental no notable dental hx. (+) Partial Upper, Dental Advisory Given   Pulmonary neg pulmonary ROS,    Pulmonary exam normal breath sounds clear to auscultation       Cardiovascular Exercise Tolerance: Good hypertension, Pt. on medications  Rhythm:Regular Rate:Normal     Neuro/Psych  Headaches, negative psych ROS   GI/Hepatic negative GI ROS, Neg liver ROS,   Endo/Other  negative endocrine ROS  Renal/GU negative Renal ROS  negative genitourinary   Musculoskeletal  (+) Arthritis , Osteoarthritis,    Abdominal   Peds  Hematology negative hematology ROS (+)   Anesthesia Other Findings   Reproductive/Obstetrics negative OB ROS                            Anesthesia Physical Anesthesia Plan  ASA: II  Anesthesia Plan: General   Post-op Pain Management:    Induction: Intravenous  PONV Risk Score and Plan: 3 and Ondansetron, Dexamethasone and Midazolam  Airway Management Planned: Oral ETT  Additional Equipment:   Intra-op Plan:   Post-operative Plan: Extubation in OR  Informed Consent: I have reviewed the patients History and Physical, chart, labs and discussed the procedure including the risks, benefits and alternatives for the proposed anesthesia with the patient or authorized representative who has indicated his/her understanding and acceptance.   Dental advisory given  Plan Discussed with: CRNA  Anesthesia Plan Comments:         Anesthesia Quick Evaluation

## 2017-08-28 NOTE — Progress Notes (Signed)
Anesthesia Chart Review:  Pt is a 71 year old male scheduled for posterior lumbar fusion 1 level on 08/29/2017 with Julio SicksHenry Pool, M.D.  - PCP is Foye Deerouglas Schultz, MD  PMH includes: HTN, hyperlipidemia. Bilateral hearing loss. Never smoker. BMI 38.  Medications include: Amlodipine  BP (!) 185/72   Pulse (!) 54   Temp 36.9 C   Resp 20   Ht 5\' 8"  (1.727 m)   Wt 248 lb 3.2 oz (112.6 kg)   SpO2 96%   BMI 37.74 kg/m   Preoperative labs reviewed.    EKG 08/22/17: Sinus bradycardia (49 bpm) with first-degree AV block.  If no changes, I anticipate pt can proceed with surgery as scheduled.   Rica Mastngela Kabbe, FNP-BC [facility] Phone: [phone] 08/28/2017 5:07 PM

## 2017-08-29 ENCOUNTER — Inpatient Hospital Stay (HOSPITAL_COMMUNITY): Payer: Medicare PPO

## 2017-08-29 ENCOUNTER — Encounter (HOSPITAL_COMMUNITY): Payer: Self-pay | Admitting: *Deleted

## 2017-08-29 ENCOUNTER — Inpatient Hospital Stay (HOSPITAL_COMMUNITY): Payer: Medicare PPO | Admitting: Certified Registered Nurse Anesthetist

## 2017-08-29 ENCOUNTER — Encounter (HOSPITAL_COMMUNITY)
Admission: RE | Disposition: A | Discharge status: Home / Self Care | Payer: Self-pay | Source: Ambulatory Visit | Attending: Neurosurgery

## 2017-08-29 ENCOUNTER — Inpatient Hospital Stay (HOSPITAL_COMMUNITY): Payer: Medicare PPO | Admitting: Emergency Medicine

## 2017-08-29 ENCOUNTER — Inpatient Hospital Stay (HOSPITAL_COMMUNITY)
Admission: RE | Admit: 2017-08-29 | Discharge: 2017-08-30 | DRG: 455 | Disposition: A | Discharge status: Home / Self Care | Payer: Medicare PPO | Source: Ambulatory Visit | Attending: Neurosurgery | Admitting: Neurosurgery

## 2017-08-29 DIAGNOSIS — M4317 Spondylolisthesis, lumbosacral region: Secondary | ICD-10-CM

## 2017-08-29 DIAGNOSIS — Z96652 Presence of left artificial knee joint: Secondary | ICD-10-CM | POA: Diagnosis present

## 2017-08-29 DIAGNOSIS — Z9849 Cataract extraction status, unspecified eye: Secondary | ICD-10-CM

## 2017-08-29 DIAGNOSIS — M1990 Unspecified osteoarthritis, unspecified site: Secondary | ICD-10-CM | POA: Diagnosis present

## 2017-08-29 DIAGNOSIS — Z888 Allergy status to other drugs, medicaments and biological substances status: Secondary | ICD-10-CM

## 2017-08-29 DIAGNOSIS — Z419 Encounter for procedure for purposes other than remedying health state, unspecified: Secondary | ICD-10-CM

## 2017-08-29 DIAGNOSIS — E785 Hyperlipidemia, unspecified: Secondary | ICD-10-CM | POA: Diagnosis present

## 2017-08-29 DIAGNOSIS — M109 Gout, unspecified: Secondary | ICD-10-CM | POA: Diagnosis present

## 2017-08-29 DIAGNOSIS — H9193 Unspecified hearing loss, bilateral: Secondary | ICD-10-CM | POA: Diagnosis present

## 2017-08-29 DIAGNOSIS — M549 Dorsalgia, unspecified: Secondary | ICD-10-CM | POA: Diagnosis present

## 2017-08-29 DIAGNOSIS — I10 Essential (primary) hypertension: Secondary | ICD-10-CM | POA: Diagnosis present

## 2017-08-29 HISTORY — PX: SPINAL FUSION: SHX223

## 2017-08-29 HISTORY — DX: Spondylolisthesis, lumbosacral region: M43.17

## 2017-08-29 SURGERY — POSTERIOR LUMBAR FUSION 1 LEVEL
Anesthesia: General | Site: Spine Lumbar

## 2017-08-29 MED ORDER — ACETAMINOPHEN 325 MG PO TABS: 650.0000 mg | ORAL_TABLET | ORAL | Status: DC | PRN

## 2017-08-29 MED ORDER — DEXAMETHASONE SODIUM PHOSPHATE 10 MG/ML IJ SOLN
INTRAMUSCULAR | Status: DC | PRN
  Administered 2017-08-29: 10 mg via INTRAVENOUS

## 2017-08-29 MED ORDER — PROPOFOL 10 MG/ML IV BOLUS
INTRAVENOUS | Status: DC | PRN
  Administered 2017-08-29: 40 mg via INTRAVENOUS

## 2017-08-29 MED ORDER — SUGAMMADEX SODIUM 200 MG/2ML IV SOLN
INTRAVENOUS | Status: DC | PRN
  Administered 2017-08-29: 200 mg via INTRAVENOUS

## 2017-08-29 MED ORDER — DIAZEPAM 5 MG PO TABS
ORAL_TABLET | ORAL | Status: AC
  Filled 2017-08-29: qty 1

## 2017-08-29 MED ORDER — LACTATED RINGERS IV SOLN
INTRAVENOUS | Status: DC | PRN
  Administered 2017-08-29 (×2): via INTRAVENOUS

## 2017-08-29 MED ORDER — DEXAMETHASONE SODIUM PHOSPHATE 10 MG/ML IJ SOLN
INTRAMUSCULAR | Status: AC
  Filled 2017-08-29: qty 1

## 2017-08-29 MED ORDER — MENTHOL 3 MG MT LOZG: 1.0000 | LOZENGE | OROMUCOSAL | Status: DC | PRN

## 2017-08-29 MED ORDER — DIAZEPAM 5 MG PO TABS
5.0000 mg | ORAL_TABLET | Freq: Four times a day (QID) | ORAL | Status: DC | PRN
  Administered 2017-08-29 (×3): 5 mg via ORAL
  Filled 2017-08-29 (×2): qty 1

## 2017-08-29 MED ORDER — THROMBIN (RECOMBINANT) 20000 UNITS EX SOLR
CUTANEOUS | Status: AC
  Filled 2017-08-29: qty 20000

## 2017-08-29 MED ORDER — ONDANSETRON HCL 4 MG/2ML IJ SOLN
INTRAMUSCULAR | Status: DC | PRN
  Administered 2017-08-29: 4 mg via INTRAVENOUS

## 2017-08-29 MED ORDER — CHLORHEXIDINE GLUCONATE CLOTH 2 % EX PADS: 6.0000 | MEDICATED_PAD | Freq: Once | CUTANEOUS | Status: DC

## 2017-08-29 MED ORDER — AMLODIPINE BESYLATE 10 MG PO TABS
10.0000 mg | ORAL_TABLET | Freq: Every day | ORAL | Status: DC
  Administered 2017-08-30: 10 mg via ORAL
  Filled 2017-08-29: qty 1

## 2017-08-29 MED ORDER — CEFAZOLIN SODIUM-DEXTROSE 1-4 GM/50ML-% IV SOLN
1.0000 g | Freq: Three times a day (TID) | INTRAVENOUS | Status: AC
  Administered 2017-08-29 (×2): 1 g via INTRAVENOUS
  Filled 2017-08-29 (×2): qty 50

## 2017-08-29 MED ORDER — BUPIVACAINE HCL (PF) 0.25 % IJ SOLN
INTRAMUSCULAR | Status: AC
  Filled 2017-08-29: qty 30

## 2017-08-29 MED ORDER — LIDOCAINE HCL (CARDIAC) 20 MG/ML IV SOLN
INTRAVENOUS | Status: DC | PRN
  Administered 2017-08-29: 80 mg via INTRAVENOUS

## 2017-08-29 MED ORDER — SODIUM CHLORIDE 0.9 % IV SOLN
250.0000 mL | INTRAVENOUS | Status: DC
  Administered 2017-08-29: 12:00:00 via INTRAVENOUS

## 2017-08-29 MED ORDER — FENTANYL CITRATE (PF) 250 MCG/5ML IJ SOLN
INTRAMUSCULAR | Status: AC
  Filled 2017-08-29: qty 5

## 2017-08-29 MED ORDER — ONDANSETRON HCL 4 MG/2ML IJ SOLN
INTRAMUSCULAR | Status: AC
  Filled 2017-08-29: qty 2

## 2017-08-29 MED ORDER — ROCURONIUM BROMIDE 10 MG/ML (PF) SYRINGE
PREFILLED_SYRINGE | INTRAVENOUS | Status: AC
  Filled 2017-08-29: qty 5

## 2017-08-29 MED ORDER — EPHEDRINE SULFATE 50 MG/ML IJ SOLN
INTRAMUSCULAR | Status: DC | PRN
  Administered 2017-08-29 (×3): 10 mg via INTRAVENOUS

## 2017-08-29 MED ORDER — POLYETHYLENE GLYCOL 3350 17 G PO PACK: 17.0000 g | PACK | Freq: Every day | ORAL | Status: DC | PRN

## 2017-08-29 MED ORDER — SERTRALINE HCL 50 MG PO TABS: 50.0000 mg | ORAL_TABLET | ORAL | Status: DC | PRN

## 2017-08-29 MED ORDER — THROMBIN (RECOMBINANT) 20000 UNITS EX SOLR
CUTANEOUS | Status: DC | PRN
  Administered 2017-08-29 (×2): via TOPICAL

## 2017-08-29 MED ORDER — VANCOMYCIN HCL 1000 MG IV SOLR
INTRAVENOUS | Status: AC
  Filled 2017-08-29: qty 1000

## 2017-08-29 MED ORDER — OXYCODONE HCL 5 MG PO TABS
10.0000 mg | ORAL_TABLET | ORAL | Status: DC | PRN
  Administered 2017-08-29 – 2017-08-30 (×3): 10 mg via ORAL
  Filled 2017-08-29 (×2): qty 2

## 2017-08-29 MED ORDER — ONDANSETRON HCL 4 MG PO TABS: 4.0000 mg | ORAL_TABLET | Freq: Four times a day (QID) | ORAL | Status: DC | PRN

## 2017-08-29 MED ORDER — GLYCOPYRROLATE 0.2 MG/ML IJ SOLN
INTRAMUSCULAR | Status: DC | PRN
  Administered 2017-08-29: 0.2 mg via INTRAVENOUS

## 2017-08-29 MED ORDER — CEFAZOLIN SODIUM-DEXTROSE 2-3 GM-%(50ML) IV SOLR
INTRAVENOUS | Status: DC | PRN
  Administered 2017-08-29: 2 g via INTRAVENOUS

## 2017-08-29 MED ORDER — ONDANSETRON HCL 4 MG/2ML IJ SOLN: 4.0000 mg | Freq: Four times a day (QID) | INTRAMUSCULAR | Status: DC | PRN

## 2017-08-29 MED ORDER — BUPIVACAINE HCL (PF) 0.25 % IJ SOLN
INTRAMUSCULAR | Status: DC | PRN
  Administered 2017-08-29: 20 mL

## 2017-08-29 MED ORDER — SODIUM CHLORIDE 0.9 % IR SOLN
Status: DC | PRN
  Administered 2017-08-29: 09:00:00

## 2017-08-29 MED ORDER — OXYCODONE HCL 5 MG PO TABS
ORAL_TABLET | ORAL | Status: AC
  Filled 2017-08-29: qty 2

## 2017-08-29 MED ORDER — BISACODYL 10 MG RE SUPP: 10.0000 mg | Freq: Every day | RECTAL | Status: DC | PRN

## 2017-08-29 MED ORDER — HYDROCODONE-ACETAMINOPHEN 10-325 MG PO TABS
1.0000 | ORAL_TABLET | ORAL | Status: DC | PRN
  Administered 2017-08-29: 1 via ORAL
  Filled 2017-08-29: qty 1

## 2017-08-29 MED ORDER — SODIUM CHLORIDE 0.9% FLUSH: 3.0000 mL | INTRAVENOUS | Status: DC | PRN

## 2017-08-29 MED ORDER — PHENYLEPHRINE HCL 10 MG/ML IJ SOLN
INTRAVENOUS | Status: DC | PRN
  Administered 2017-08-29: 20 ug/min via INTRAVENOUS

## 2017-08-29 MED ORDER — SUGAMMADEX SODIUM 200 MG/2ML IV SOLN
INTRAVENOUS | Status: AC
  Filled 2017-08-29: qty 2

## 2017-08-29 MED ORDER — IBUPROFEN 200 MG PO TABS: 400.0000 mg | ORAL_TABLET | Freq: Every day | ORAL | Status: DC | PRN

## 2017-08-29 MED ORDER — SODIUM CHLORIDE 0.9% FLUSH: 3.0000 mL | Freq: Two times a day (BID) | INTRAVENOUS | Status: DC

## 2017-08-29 MED ORDER — FENTANYL CITRATE (PF) 100 MCG/2ML IJ SOLN
INTRAMUSCULAR | Status: DC | PRN
  Administered 2017-08-29: 100 ug via INTRAVENOUS
  Administered 2017-08-29: 50 ug via INTRAVENOUS

## 2017-08-29 MED ORDER — VANCOMYCIN HCL 1000 MG IV SOLR
INTRAVENOUS | Status: DC | PRN
  Administered 2017-08-29: 1000 mg via TOPICAL

## 2017-08-29 MED ORDER — HYDROMORPHONE HCL 1 MG/ML IJ SOLN
0.2500 mg | INTRAMUSCULAR | Status: DC | PRN
  Administered 2017-08-29: 0.25 mg via INTRAVENOUS

## 2017-08-29 MED ORDER — PROPOFOL 10 MG/ML IV BOLUS
INTRAVENOUS | Status: AC
  Filled 2017-08-29: qty 20

## 2017-08-29 MED ORDER — ROCURONIUM BROMIDE 100 MG/10ML IV SOLN
INTRAVENOUS | Status: DC | PRN
  Administered 2017-08-29: 60 mg via INTRAVENOUS
  Administered 2017-08-29 (×2): 10 mg via INTRAVENOUS
  Administered 2017-08-29: 20 mg via INTRAVENOUS

## 2017-08-29 MED ORDER — FLEET ENEMA 7-19 GM/118ML RE ENEM: 1.0000 | ENEMA | Freq: Once | RECTAL | Status: DC | PRN

## 2017-08-29 MED ORDER — ACETAMINOPHEN 650 MG RE SUPP: 650.0000 mg | RECTAL | Status: DC | PRN

## 2017-08-29 MED ORDER — LIDOCAINE 2% (20 MG/ML) 5 ML SYRINGE
INTRAMUSCULAR | Status: AC
  Filled 2017-08-29: qty 5

## 2017-08-29 MED ORDER — 0.9 % SODIUM CHLORIDE (POUR BTL) OPTIME
TOPICAL | Status: DC | PRN
  Administered 2017-08-29: 1000 mL

## 2017-08-29 MED ORDER — HYDROMORPHONE HCL 1 MG/ML IJ SOLN
INTRAMUSCULAR | Status: AC
  Filled 2017-08-29: qty 1

## 2017-08-29 MED ORDER — CEFAZOLIN SODIUM-DEXTROSE 2-4 GM/100ML-% IV SOLN
INTRAVENOUS | Status: AC
Start: 2017-08-29 — End: 2017-08-29
  Filled 2017-08-29: qty 100

## 2017-08-29 MED ORDER — LACTATED RINGERS IV SOLN
INTRAVENOUS | Status: DC
  Administered 2017-08-29: 07:00:00 via INTRAVENOUS

## 2017-08-29 MED ORDER — PHENOL 1.4 % MT LIQD: 1.0000 | OROMUCOSAL | Status: DC | PRN

## 2017-08-29 MED ORDER — CEFAZOLIN SODIUM-DEXTROSE 2-4 GM/100ML-% IV SOLN: 2.0000 g | INTRAVENOUS | Status: DC

## 2017-08-29 MED ORDER — HYDROMORPHONE HCL 1 MG/ML IJ SOLN: 1.0000 mg | INTRAMUSCULAR | Status: DC | PRN

## 2017-08-29 MED ORDER — EPHEDRINE 5 MG/ML INJ
INTRAVENOUS | Status: AC
  Filled 2017-08-29: qty 10

## 2017-08-29 SURGICAL SUPPLY — 64 items
ADH SKN CLS APL DERMABOND .7 (GAUZE/BANDAGES/DRESSINGS) ×1
APL SKNCLS STERI-STRIP NONHPOA (GAUZE/BANDAGES/DRESSINGS) ×1
BAG DECANTER FOR FLEXI CONT (MISCELLANEOUS) ×3 IMPLANT
BENZOIN TINCTURE PRP APPL 2/3 (GAUZE/BANDAGES/DRESSINGS) ×3 IMPLANT
BUR CUTTER 7.0 ROUND (BURR) ×2 IMPLANT
BUR MATCHSTICK NEURO 3.0 LAGG (BURR) ×3 IMPLANT
CANISTER SUCT 3000ML PPV (MISCELLANEOUS) ×3 IMPLANT
CAP LCK SPNE (Orthopedic Implant) ×4 IMPLANT
CAP LOCK SPINE RADIUS (Orthopedic Implant) IMPLANT
CAP LOCKING (Orthopedic Implant) ×12 IMPLANT
CARTRIDGE OIL MAESTRO DRILL (MISCELLANEOUS) ×1 IMPLANT
CLOSURE WOUND 1/2 X4 (GAUZE/BANDAGES/DRESSINGS) ×1
CONT SPEC 4OZ CLIKSEAL STRL BL (MISCELLANEOUS) ×3 IMPLANT
COVER BACK TABLE 60X90IN (DRAPES) ×3 IMPLANT
DERMABOND ADVANCED (GAUZE/BANDAGES/DRESSINGS) ×2
DERMABOND ADVANCED .7 DNX12 (GAUZE/BANDAGES/DRESSINGS) ×1 IMPLANT
DEVICE INTERBODY ELEVATE 23X10 (Cage) ×4 IMPLANT
DIFFUSER DRILL AIR PNEUMATIC (MISCELLANEOUS) ×3 IMPLANT
DRAPE C-ARM 42X72 X-RAY (DRAPES) ×6 IMPLANT
DRAPE HALF SHEET 40X57 (DRAPES) IMPLANT
DRAPE LAPAROTOMY 100X72X124 (DRAPES) ×3 IMPLANT
DRAPE POUCH INSTRU U-SHP 10X18 (DRAPES) ×3 IMPLANT
DRAPE SURG 17X23 STRL (DRAPES) ×12 IMPLANT
DRSG OPSITE POSTOP 4X6 (GAUZE/BANDAGES/DRESSINGS) ×2 IMPLANT
DURAPREP 26ML APPLICATOR (WOUND CARE) ×3 IMPLANT
ELECT REM PT RETURN 9FT ADLT (ELECTROSURGICAL) ×3
ELECTRODE REM PT RTRN 9FT ADLT (ELECTROSURGICAL) ×1 IMPLANT
EVACUATOR 1/8 PVC DRAIN (DRAIN) ×3 IMPLANT
GAUZE SPONGE 4X4 12PLY STRL (GAUZE/BANDAGES/DRESSINGS) IMPLANT
GAUZE SPONGE 4X4 16PLY XRAY LF (GAUZE/BANDAGES/DRESSINGS) IMPLANT
GLOVE BIOGEL PI IND STRL 6.5 (GLOVE) IMPLANT
GLOVE BIOGEL PI IND STRL 7.0 (GLOVE) IMPLANT
GLOVE BIOGEL PI IND STRL 7.5 (GLOVE) IMPLANT
GLOVE BIOGEL PI INDICATOR 6.5 (GLOVE) ×4
GLOVE BIOGEL PI INDICATOR 7.0 (GLOVE) ×4
GLOVE BIOGEL PI INDICATOR 7.5 (GLOVE) ×6
GLOVE ECLIPSE 9.0 STRL (GLOVE) ×6 IMPLANT
GLOVE EXAM NITRILE XL STR (GLOVE) IMPLANT
GOWN STRL REUS W/ TWL LRG LVL3 (GOWN DISPOSABLE) IMPLANT
GOWN STRL REUS W/ TWL XL LVL3 (GOWN DISPOSABLE) ×2 IMPLANT
GOWN STRL REUS W/TWL 2XL LVL3 (GOWN DISPOSABLE) IMPLANT
GOWN STRL REUS W/TWL LRG LVL3 (GOWN DISPOSABLE) ×9
GOWN STRL REUS W/TWL XL LVL3 (GOWN DISPOSABLE) ×6
KIT BASIN OR (CUSTOM PROCEDURE TRAY) ×3 IMPLANT
KIT ROOM TURNOVER OR (KITS) ×3 IMPLANT
MILL MEDIUM DISP (BLADE) ×3 IMPLANT
NEEDLE HYPO 22GX1.5 SAFETY (NEEDLE) ×3 IMPLANT
NS IRRIG 1000ML POUR BTL (IV SOLUTION) ×3 IMPLANT
OIL CARTRIDGE MAESTRO DRILL (MISCELLANEOUS) ×3
PACK LAMINECTOMY NEURO (CUSTOM PROCEDURE TRAY) ×3 IMPLANT
ROD RADIUS 45MM (Rod) ×6 IMPLANT
ROD SPNL 45X5.5XNS TI RDS (Rod) IMPLANT
SCREW 6.75X40MM (Screw) ×2 IMPLANT
SCREW 6.75X45MM (Screw) ×6 IMPLANT
SPONGE SURGIFOAM ABS GEL 100 (HEMOSTASIS) ×5 IMPLANT
STRIP CLOSURE SKIN 1/2X4 (GAUZE/BANDAGES/DRESSINGS) ×3 IMPLANT
SUT VIC AB 0 CT1 18XCR BRD8 (SUTURE) ×2 IMPLANT
SUT VIC AB 0 CT1 8-18 (SUTURE) ×3
SUT VIC AB 2-0 CT1 18 (SUTURE) ×3 IMPLANT
SUT VIC AB 3-0 SH 8-18 (SUTURE) ×6 IMPLANT
TOWEL GREEN STERILE (TOWEL DISPOSABLE) ×3 IMPLANT
TOWEL GREEN STERILE FF (TOWEL DISPOSABLE) ×3 IMPLANT
TRAY FOLEY W/METER SILVER 16FR (SET/KITS/TRAYS/PACK) ×3 IMPLANT
WATER STERILE IRR 1000ML POUR (IV SOLUTION) ×3 IMPLANT

## 2017-08-29 NOTE — Progress Notes (Signed)
Orthopedic Tech Progress Note Patient Details:  Larry HighlandJohnie G Maiello 05/24/1946 [id number] Patient already has brace. Patient ID: Larry Haynes, male   DOB: 09/29/1946, 71 y.o.   MRN: [id number]   [name], [name] 08/29/2017, 2:20 PM

## 2017-08-29 NOTE — Brief Op Note (Signed)
08/29/2017  10:54 AM  PATIENT:  Larry Haynes  71 y.o. male  PRE-OPERATIVE DIAGNOSIS:  Spondylolisthesis  POST-OPERATIVE DIAGNOSIS:  Spondylolisthesis  PROCEDURE:  Procedure(s): LUMBAR FIVE-SACRAL ONE POSTERIOR LUMBAR FUSION (N/A)  SURGEON:  Surgeon(s) and Role:    * Ericberto Padget, Sherilyn CooterHenry, MD - Primary    * Lisbeth RenshawNundkumar, Neelesh, MD - Assisting  PHYSICIAN ASSISTANT:   ASSISTANTS:    ANESTHESIA:   general  EBL:  50 mL   BLOOD ADMINISTERED:none  DRAINS: none   LOCAL MEDICATIONS USED:  MARCAINE     SPECIMEN:  No Specimen  DISPOSITION OF SPECIMEN:  N/A  COUNTS:  YES  TOURNIQUET:  * No tourniquets in log *  DICTATION: .Dragon Dictation  PLAN OF CARE: Admit to inpatient   PATIENT DISPOSITION:  PACU - hemodynamically stable.   Delay start of Pharmacological VTE agent (>24hrs) due to surgical blood loss or risk of bleeding: yes

## 2017-08-29 NOTE — Anesthesia Procedure Notes (Addendum)
Procedure Name: Intubation Date/Time: 08/29/2017 8:18 AM Performed by: Oletta Lamas, CRNA Pre-anesthesia Checklist: Patient identified, Emergency Drugs available, Suction available and Patient being monitored Patient Re-evaluated:Patient Re-evaluated prior to induction Oxygen Delivery Method: Circle System Utilized Preoxygenation: Pre-oxygenation with 100% oxygen Induction Type: IV induction Ventilation: Mask ventilation without difficulty and Oral airway inserted - appropriate to patient size Laryngoscope Size: Mac and 4 Grade View: Grade II Tube type: Oral Tube size: 7.5 mm Number of attempts: 2 Airway Equipment and Method: Stylet and Oral airway Placement Confirmation: ETT inserted through vocal cords under direct vision,  positive ETCO2 and breath sounds checked- equal and bilateral Secured at: 23 cm Tube secured with: Tape Dental Injury: Teeth and Oropharynx as per pre-operative assessment  Comments: Initial DVL by Vickii Penna, SRNA, second attempt by Josh, CRNA, oral ETT placed, +Et CO2, BBS, bilateral chest rise and fall.

## 2017-08-29 NOTE — Anesthesia Postprocedure Evaluation (Signed)
Anesthesia Post Note  Patient: Raynelle HighlandJohnie G Ragon  Procedure(s) Performed: LUMBAR FIVE-SACRAL ONE POSTERIOR LUMBAR FUSION (N/A Spine Lumbar)     Patient location during evaluation: PACU Anesthesia Type: General Level of consciousness: awake and alert Pain management: pain level controlled Vital Signs Assessment: post-procedure vital signs reviewed and stable Respiratory status: spontaneous breathing, nonlabored ventilation, respiratory function stable and patient connected to nasal cannula oxygen Cardiovascular status: blood pressure returned to baseline and stable Postop Assessment: no apparent nausea or vomiting Anesthetic complications: no    Last Vitals:  Vitals:   08/29/17 1215 08/29/17 1227  BP: 124/68   Pulse: (!) 52   Resp: 15   Temp:  (!) 36.3 C  SpO2: 96%     Last Pain:  Vitals:   08/29/17 1227  PainSc: 4     LLE Motor Response: Purposeful movement;Responds to commands (08/29/17 1227) LLE Sensation: Full sensation (08/29/17 1227) RLE Motor Response: Purposeful movement;Responds to commands (08/29/17 1227) RLE Sensation: Full sensation (08/29/17 1227)      Lue Sykora,W. EDMOND

## 2017-08-29 NOTE — Evaluation (Signed)
Physical Therapy Evaluation Patient Details Name: Larry HighlandJohnie G Haynes MRN: [id number] DOB: 04/04/1946 Today's Date: 08/29/2017   History of Present Illness  Patient is a 71 yo male s/p LUMBAR FIVE-SACRAL ONE POSTERIOR LUMBAR FUSION   Clinical Impression  Orders received for PT evaluation. Patient demonstrates deficits in functional mobility as indicated below. Will benefit from continued skilled PT to address deficits and maximize function. Will see as indicated and progress as tolerated.      Follow Up Recommendations No PT follow up;Supervision - Intermittent    Equipment Recommendations  None recommended by PT    Recommendations for Other Services       Precautions / Restrictions Precautions Precautions: Back Precaution Booklet Issued: Yes (comment) Precaution Comments: verbally reviewed with patient Required Braces or Orthoses: Spinal Brace Spinal Brace: Lumbar corset Restrictions Weight Bearing Restrictions: No      Mobility  Bed Mobility Overal bed mobility: Needs Assistance Bed Mobility: Rolling;Sidelying to Sit;Sit to Sidelying Rolling: Min guard Sidelying to sit: Min guard     Sit to sidelying: Min assist General bed mobility comments: min assist to elevate LEs back to bed  Transfers Overall transfer level: Needs assistance Equipment used: None Transfers: Sit to/from Stand Sit to Stand: Min guard         General transfer comment: min guard for safety, Vcs for positioning at EOB and push to upright  Ambulation/Gait Ambulation/Gait assistance: Min guard Ambulation Distance (Feet): 120 Feet Assistive device: None Gait Pattern/deviations: Step-through pattern;Decreased stride length;Drifts right/left Gait velocity: decreased   General Gait Details: VCs for increased cadence, some instability noted  Stairs            Wheelchair Mobility    Modified Rankin (Stroke Patients Only)       Balance Overall balance assessment: Needs assistance    Sitting balance-Leahy Scale: Good Sitting balance - Comments: able to sit EOB unsupported     Standing balance-Leahy Scale: Fair Standing balance comment: able to static stand unsupported with increased sway noted                             Pertinent Vitals/Pain Pain Assessment: Faces Faces Pain Scale: Hurts even more Pain Location: buttocks and low back Pain Descriptors / Indicators: Operative site guarding Pain Intervention(s): Monitored during session    Home Living Family/patient expects to be discharged to:: Private residence Living Arrangements: Spouse/significant other Available Help at Discharge: Family Type of Home: House Home Access: Stairs to enter Entrance Stairs-Rails: Can reach both Entrance Stairs-Number of Steps: 4 Home Layout: One level Home Equipment: Toilet riser      Prior Function Level of Independence: Independent               Hand Dominance   Dominant Hand: Right    Extremity/Trunk Assessment   Upper Extremity Assessment Upper Extremity Assessment: Overall WFL for tasks assessed    Lower Extremity Assessment Lower Extremity Assessment: Overall WFL for tasks assessed       Communication   Communication: No difficulties  Cognition Arousal/Alertness: Awake/alert Behavior During Therapy: WFL for tasks assessed/performed Overall Cognitive Status: Within Functional Limits for tasks assessed                                        General Comments      Exercises     Assessment/Plan  PT Assessment Patient needs continued PT services  PT Problem List Decreased activity tolerance;Decreased balance;Decreased mobility;Decreased knowledge of precautions;Pain       PT Treatment Interventions DME instruction;Gait training;Functional mobility training;Therapeutic activities;Therapeutic exercise;Patient/family education;Balance training;Stair training    PT Goals (Current goals can be found in the Care  Plan section)  Acute Rehab PT Goals Patient Stated Goal: to go home PT Goal Formulation: With patient/family Time For Goal Achievement: 09/12/17 Potential to Achieve Goals: Good    Frequency Min 5X/week   Barriers to discharge        Co-evaluation               AM-PAC PT "6 Clicks" Daily Activity  Outcome Measure Difficulty turning over in bed (including adjusting bedclothes, sheets and blankets)?: A Little Difficulty moving from lying on back to sitting on the side of the bed? : A Little Difficulty sitting down on and standing up from a chair with arms (e.g., wheelchair, bedside commode, etc,.)?: A Lot Help needed moving to and from a bed to chair (including a wheelchair)?: A Little Help needed walking in hospital room?: A Little Help needed climbing 3-5 steps with a railing? : A Lot 6 Click Score: 16    End of Session Equipment Utilized During Treatment: Oxygen;Back brace;Gait belt Activity Tolerance: Patient limited by fatigue Patient left: in bed;with call bell/phone within reach;with SCD's reapplied;with family/visitor present Nurse Communication: Mobility status PT Visit Diagnosis: Unsteadiness on feet (R26.81);Difficulty in walking, not elsewhere classified (R26.2)    Time: [id number] PT Time Calculation (min) (ACUTE ONLY): 21 min   Charges:   PT Evaluation $PT Eval Moderate Complexity: 1 Mod     PT G Codes:   PT G-Codes **NOT FOR INPATIENT CLASS** Functional Assessment Tool Used: Clinical judgement Functional Limitation: Mobility: Walking and moving around Mobility: Walking and Moving Around Current Status ([id number]([id number]): At least 20 percent but less than 40 percent impaired, limited or restricted Mobility: Walking and Moving Around Goal Status [phone](G8979): At least 1 percent but less than 20 percent impaired, limited or restricted    Charlotte Crumbevon Arihana Ambrocio, PT DPT  Board Certified Neurologic Specialist [phone]   Fabio AsaDevon J Tyria Springer 08/29/2017, 4:07 PM

## 2017-08-29 NOTE — H&P (Signed)
  Larry HighlandJohnie G Haynes is an 71 y.o. male.   Chief Complaint: back pain HPI: 71 year old male with chronic and progressive back pain with bilateral lower extremity symptoms failing conservative management. Workup demonstrates evidence of a mobile grade 2 L5-S1 lytic spondylolisthesis. Patient with multilevel disc degeneration above but no evidence of significant stenosis or disc herniation. Patient presents now for L5-S1 decompression infusion is of improving his symptoms.  Past Medical History:  Diagnosis Date  . Arthritis   . Bilateral hearing loss   . Gout   . Hyperlipemia   . Hypertension     Past Surgical History:  Procedure Laterality Date  . CATARACT EXTRACTION    . SKIN CANCER EXCISION     BOTTOM  LIP  . TOTAL KNEE ARTHROPLASTY Left     History reviewed. No pertinent family history. Social History:  reports that  has never smoked. he has never used smokeless tobacco. He reports that he does not drink alcohol or use drugs.  Allergies:  Allergies  Allergen Reactions  . Other Hives    Hot fudge  . Prednisone Other (See Comments)    hyperactivity    Medications Prior to Admission  Medication Sig Dispense Refill  . amLODipine (NORVASC) 10 MG tablet Take 10 mg daily by mouth.     Marland Kitchen. ibuprofen (ADVIL,MOTRIN) 200 MG tablet Take 400 mg daily as needed by mouth for mild pain.    . benzonatate (TESSALON) 100 MG capsule Take 1 capsule (100 mg total) by mouth every 8 (eight) hours. (Patient not taking: Reported on 08/15/2017) 21 capsule 0  . cetirizine (ZYRTEC ALLERGY) 10 MG tablet Take 1 tablet (10 mg total) by mouth daily. (Patient not taking: Reported on 08/15/2017) 30 tablet 1  . sertraline (ZOLOFT) 50 MG tablet Take 50 mg by mouth as needed.      No results found for this or any previous visit (from the past 48 hour(s)). No results found.  Pertinent items noted in HPI and remainder of comprehensive ROS otherwise negative.  Blood pressure (!) 185/71, pulse (!) 47, resp. rate  20, height 5\' 8"  (1.727 m), weight 111.6 kg (246 lb), SpO2 96 %.  Patient is awake and alert. He is oriented and appropriate. His cranial nerve function is intact. Speech is fluent. Judgment and insight are intact. Motor examination with some mild dorsiflexion weakness bilaterally otherwise motor strength intact. Sensory examination nonfocal. Deep tendon versus are hypoactive in both lower extremities. No evidence of long track signs. Gaitantalgic. Posture moderately flexed. Examination head ears eyes and throat is unremarkable. Chest and abdomen are benign. Extremities are free from injury or deformity. Assessment/Plan  Grade 2 L5-S1 lytic spondylolisthesis. PlanL5-S1 Gill procedure with L5-S1 posterior lumbar interbody fusion utilizing interbody cages, locally harvested autograft, and augmented with posterior lateral arthrodesis utilizing nonsegmental pedicle screw fixation and local autographing. Risks and benefits been explained. Patient wishes to proceed.  Sherilyn CooterHenry A Dauna Ziska 08/29/2017, 7:54 AM

## 2017-08-29 NOTE — Op Note (Signed)
Date of procedure: 08/29/2017  Date of dictation: same  Service: Neurosurgery  Preoperative diagnosis: grade 2 L5-S1 lytic, mobile spondylolisthesis  Postoperative diagnosis: same  Procedure Name: L5 Gill procedure with bilateral L5 and S1 decompressive foraminotomies, more than would be required for simple interbody fusion alone  L5-S1 posterior lumbar interbody fusion utilizing interbody cages and locally harvested autograft  L5-S1 posterior lateral arthrodesis utilizing nonsegmental pedicle screw fixation and local autografting    Surgeon:Darleth Eustache A.Janis Sol, M.D.  Asst. Surgeon: Conchita ParisNundkumar  Anesthesia: General  Indication:[age] year old male with back and bilateral lower extremity symptoms which are worsening. Workup demonstrates evidence of a mobile, lytic grade 2 spondylolisthesis at L5-S1 with marked foraminal stenosis and significant lateral recess stenosis. Patient is failed conservative management. He presents now for decompression and fusion.  Operative note:after induction of anesthesia, patient position prone onto Wilson frame appropriate padded. Lumbar region prepped and draped sterilely. Incision made overlying L5-S1. Dissection performed bilaterally. Retractor placed. Fluoroscopy used. Levels confirmed. Decompressive laminectomy and facetectomies were performed using Leksell rongeurs and Kerrison rongeurs to remove the entire lamina of L5 the entire inferior facet complex of L5 bilaterally and the rudimentary inferior process and the pars interarticularis of L5 bilaterally. Superior facetectomies at S1 were performed bilaterally completing a Gill decompression bilaterally. Ligament flavum elevated and resected. Decompressive foraminotomies completed on the course exiting L5 and S1 nerve roots bilaterally. Bilateral discectomies were then performed. Interbody disc space was prepared for fusion. Soft tissue removed and interspace. Distractors placed patient's right side. A 10 mm extra  lordotic Medtronic expandable cage packed with locally harvested autograft was impacted into place and expanded to its full extent. Distractor was removed patient's right side. Disc space was once again prepared for a nurse body fusion. Soft tissue removed and interspace. Morselize autograft packed and interspace. A second cage packed with locally harvested autograft was impacted in place and expanded to its full extent. Pedicles of L5 and S1 were identified using surface landmarks and intraoperative fluoroscopy. Superficial bone overlying the pedicle was removed using high-speed drill. Each pedicles and probed using a pedicle awl each pedicle awl track was probed and found to be solidly within the bone. Each pedicle awl track was then tapped with a screw tap. 6.75 motor radius brand screws placed bilaterally at L5 and S1. Final images revealed good position of the cages and the hardware at proper upper level with normal alignment is spine. Short segment titanium rod placed over the screw heads at L5 and S1. Locking caps placed over the screws were locking caps and engaged with the construct under mild compression.Wounds and irrigated one final time. Hemostasis was assured with bipolar cautery. Wounds was then closed in layers with Vicryl sutures. Steri-Strips and sterile dressing were applied. No apparent complications. Patient tolerated the procedure well and he returns to the recovery room postop.

## 2017-08-29 NOTE — Transfer of Care (Signed)
Immediate Anesthesia Transfer of Care Note  Patient: Larry HighlandJohnie G Haynes  Procedure(s) Performed: LUMBAR FIVE-SACRAL ONE POSTERIOR LUMBAR FUSION (N/A Spine Lumbar)  Patient Location: PACU  Anesthesia Type:General  Level of Consciousness: drowsy, patient cooperative and responds to stimulation  Airway & Oxygen Therapy: Patient Spontanous Breathing and Patient connected to face mask oxygen  Post-op Assessment: Report given to RN and Post -op Vital signs reviewed and stable  Post vital signs: Reviewed and stable  Last Vitals:  Vitals:   08/29/17 0628  BP: (!) 185/71  Pulse: (!) 47  Resp: 20  SpO2: 96%    Last Pain:  Vitals:   08/29/17 0652  PainSc: 6          Complications: No apparent anesthesia complications

## 2017-08-30 MED ORDER — HYDROCODONE-ACETAMINOPHEN 10-325 MG PO TABS: 1.0000 | ORAL_TABLET | ORAL | 0 refills | Status: DC | PRN

## 2017-08-30 MED ORDER — DIAZEPAM 5 MG PO TABS: 5.0000 mg | ORAL_TABLET | Freq: Four times a day (QID) | ORAL | 0 refills | Status: DC | PRN

## 2017-08-30 MED FILL — Heparin Sodium (Porcine) Inj 1000 Unit/ML: INTRAMUSCULAR | Qty: 30 | Status: AC

## 2017-08-30 MED FILL — Sodium Chloride IV Soln 0.9%: INTRAVENOUS | Qty: 1000 | Status: AC

## 2017-08-30 NOTE — Discharge Instructions (Signed)

## 2017-08-30 NOTE — Progress Notes (Signed)
Pt and wife given D/C instructions with Rx's, verbal understanding was provided. Pt's incision is stained but has no sign of infection. Pt's IV was removed prior to D/C. Pt D/C'd home via wheelchair per MD order. Pt is stable @ D/C and has no other needs at this time. Rema FendtAshley Adream Parzych, RN

## 2017-08-30 NOTE — Evaluation (Signed)
Occupational Therapy Evaluation and Discharge Patient Details Name: Larry Haynes MRN: [id number] DOB: 10/25/1945 Today's Date: 08/30/2017    History of Present Illness Pt is a 71 y.o. male s/p L5-S1 PLIF. PMHx: Arthtitis, Bil hearing loss, Gout, Hyperlipemia, HTN, L TKA.   Clinical Impression   Pt reports he was independent with ADL PTA. Currently pt min guard with ADL and functional mobility with the exception of min-mod assist for LB ADL. All back, safety, and ADL education completed with pt and wife. Pt planning to d/c home with 24/7 supervision from family. No further acute OT needs identified; signing off at this time. Please re-consult if needs change. Thank you for this referral.     Follow Up Recommendations  No OT follow up;Supervision/Assistance - 24 hour    Equipment Recommendations  3 in 1 bedside commode    Recommendations for Other Services       Precautions / Restrictions Precautions Precautions: Back Precaution Booklet Issued: No Precaution Comments: Pt able to recall 2/3 back precautions. Reviewed all precautions with pt. Required Braces or Orthoses: Spinal Brace Spinal Brace: Lumbar corset Restrictions Weight Bearing Restrictions: No      Mobility Bed Mobility               General bed mobility comments: Pt OOB in bathroom upon arrival  Transfers Overall transfer level: Needs assistance Equipment used: None Transfers: Sit to/from Stand Sit to Stand: Min guard         General transfer comment: for safety, good posture and technique throughout    Balance Overall balance assessment: Needs assistance Sitting-balance support: Feet supported;No upper extremity supported Sitting balance-Leahy Scale: Good     Standing balance support: No upper extremity supported;During functional activity Standing balance-Leahy Scale: Fair                             ADL either performed or assessed with clinical judgement   ADL Overall  ADL's : Needs assistance/impaired Eating/Feeding: Set up;Sitting   Grooming: Supervision/safety;Standing Grooming Details (indicate cue type and reason): Educated on use of 2 cups for oral care Upper Body Bathing: Set up;Supervision/ safety;Sitting   Lower Body Bathing: Minimal assistance;Sit to/from stand   Upper Body Dressing : Set up;Supervision/safety;Sitting Upper Body Dressing Details (indicate cue type and reason): for shirt and brace Lower Body Dressing: Moderate assistance;Sit to/from stand Lower Body Dressing Details (indicate cue type and reason): for underwear and pants--assist to start over feet and pull up in standing. Wife to assist as needed Toilet Transfer: Min guard;Ambulation;Comfort height toilet Toilet Transfer Details (indicate cue type and reason): Simulated by sit to stand from EOB with functional mobility    Toileting - Clothing Manipulation Details (indicate cue type and reason): Educated on proper tecnique for peri care without twisting and use of wet wipes Tub/ Shower Transfer: Min guard;Walk-in shower;Ambulation;3 in 1 Tub/Shower Transfer Details (indicate cue type and reason): Educated on use of 3 in 1 in shower as a seat and supervision for safety initially Functional mobility during ADLs: Min guard General ADL Comments: Educated pt on maintaining back precautions during functional activities, keeping frequently used items at counter top height, frequent mobility thoruhgout the day upon return home.     Vision         Perception     Praxis      Pertinent Vitals/Pain Pain Assessment: 0-10 Pain Score: 8  Pain Location: back Pain Descriptors / Indicators: Aching;Grimacing;Guarding Pain  Intervention(s): Monitored during session;Repositioned     Hand Dominance Right   Extremity/Trunk Assessment Upper Extremity Assessment Upper Extremity Assessment: Overall WFL for tasks assessed   Lower Extremity Assessment Lower Extremity Assessment: Defer to  PT evaluation   Cervical / Trunk Assessment Cervical / Trunk Assessment: Kyphotic;Other exceptions Cervical / Trunk Exceptions: s/p PLIF   Communication Communication Communication: No difficulties   Cognition Arousal/Alertness: Awake/alert Behavior During Therapy: WFL for tasks assessed/performed Overall Cognitive Status: Within Functional Limits for tasks assessed                                     General Comments       Exercises     Shoulder Instructions      Home Living Family/patient expects to be discharged to:: Private residence Living Arrangements: Spouse/significant other Available Help at Discharge: Family Type of Home: House Home Access: Stairs to enter Secretary/administratorntrance Stairs-Number of Steps: 4 Entrance Stairs-Rails: Can reach both Home Layout: One level     Bathroom Shower/Tub: Producer, television/film/videoWalk-in shower   Bathroom Toilet: Handicapped height     Home Equipment: Other (comment);Grab bars - toilet(handles on toilet)          Prior Functioning/Environment Level of Independence: Independent                 OT Problem List:        OT Treatment/Interventions:      OT Goals(Current goals can be found in the care plan section) Acute Rehab OT Goals Patient Stated Goal: to go home OT Goal Formulation: All assessment and education complete, DC therapy  OT Frequency:     Barriers to D/C:            Co-evaluation              AM-PAC PT "6 Clicks" Daily Activity     Outcome Measure Help from another person eating meals?: None Help from another person taking care of personal grooming?: A Little Help from another person toileting, which includes using toliet, bedpan, or urinal?: A Little Help from another person bathing (including washing, rinsing, drying)?: A Little Help from another person to put on and taking off regular upper body clothing?: A Little Help from another person to put on and taking off regular lower body clothing?: A Lot 6  Click Score: 18   End of Session Equipment Utilized During Treatment: Back brace Nurse Communication: Mobility status;Other (comment)(equipment and f/u needs)  Activity Tolerance: Patient tolerated treatment well Patient left: with family/visitor present;Other (comment)(sitting EOB)  OT Visit Diagnosis: Unsteadiness on feet (R26.81);Pain Pain - part of body: (back)                Time: [id number] OT Time Calculation (min): 15 min Charges:  OT General Charges $OT Visit: 1 Visit OT Evaluation $OT Eval Moderate Complexity: 1 Mod G-Codes:     Larry Haynes A. Brett Albinooffey, M.S., OTR/L Pager: [phone]   Larry Haynes 08/30/2017, 8:59 AM

## 2017-08-30 NOTE — Discharge Summary (Signed)
Physician Discharge Summary  Patient ID: Larry Haynes MRN: [id number] DOB/AGE: 73/02/1946 73 y.o.  Admit date: 08/29/2017 Discharge date: 08/30/2017  Admission Diagnoses:  Discharge Diagnoses:  Active Problems:   Spondylolisthesis at L5-S1 level   Discharged Condition: good  Hospital Course: Patient mid-hospital where he underwent uncomplicated L5-S1 decompression and fusion. Postoperatively doing well. Preoperative back and lower extremity pain much improved. And related without difficulty. Ready for discharge home.  Consults:   Significant Diagnostic Studies:   Treatments:   Discharge Exam: Blood pressure (!) 151/69, pulse (!) 56, temperature 99.3 F (37.4 C), resp. rate 18, height 5\' 8"  (1.727 m), weight 111.6 kg (246 lb), SpO2 93 %. Awake and alert. Oriented and appropriate. Motor sensory function intact. Wound clean and dry. Chest and abdomen benign.  Disposition: 01-Home or Self Care   Allergies as of 08/30/2017      Reactions   Other Hives   Hot fudge   Prednisone Other (See Comments)   hyperactivity      Medication List    TAKE these medications   amLODipine 10 MG tablet Commonly known as:  NORVASC Take 10 mg daily by mouth.   diazepam 5 MG tablet Commonly known as:  VALIUM Take 1-2 tablets (5-10 mg total) by mouth every 6 (six) hours as needed for muscle spasms.   HYDROcodone-acetaminophen 10-325 MG tablet Commonly known as:  NORCO Take 1-2 tablets by mouth every 4 (four) hours as needed for moderate pain ((score 4 to 6)).   ibuprofen 200 MG tablet Commonly known as:  ADVIL,MOTRIN Take 400 mg daily as needed by mouth for mild pain.   sertraline 50 MG tablet Commonly known as:  ZOLOFT Take 50 mg by mouth as needed.            Durable Medical Equipment  (From admission, onward)        Start     Ordered   08/29/17 1243  DME Walker rolling  Once    Question:  Patient needs a walker to treat with the following condition  Answer:   Spondylolisthesis at L5-S1 level   08/29/17 1242   08/29/17 1243  DME 3 n 1  Once     08/29/17 1242       Signed: Sherilyn CooterHenry A Estevan Kersh 08/30/2017, 11:08 AM

## 2017-08-30 NOTE — Progress Notes (Signed)
Physical Therapy Treatment Patient Details Name: Larry HighlandJohnie G Haynes MRN: [id number] DOB: 04/26/1946 Today's Date: 08/30/2017    History of Present Illness Pt is a 71 y.o. male s/p L5-S1 PLIF. PMHx: Arthtitis, Bil hearing loss, Gout, Hyperlipemia, HTN, L TKA.    PT Comments    Pt making good progress with mobility and successfully completed stair training with spouse present throughout. PT will continue to follow acutely to ensure a safe d/c home.   Follow Up Recommendations  No PT follow up;Supervision - Intermittent     Equipment Recommendations  None recommended by PT    Recommendations for Other Services       Precautions / Restrictions Precautions Precautions: Back Precaution Booklet Issued: No Precaution Comments: PT reviewed 3/3 back precautions with pt throughout Required Braces or Orthoses: Spinal Brace Spinal Brace: Lumbar corset Restrictions Weight Bearing Restrictions: No    Mobility  Bed Mobility Overal bed mobility: Needs Assistance Bed Mobility: Rolling;Sidelying to Sit Rolling: Supervision Sidelying to sit: Min guard       General bed mobility comments: increased time and effort, good use of log roll technique, min guard for safety  Transfers Overall transfer level: Needs assistance Equipment used: None Transfers: Sit to/from Stand Sit to Stand: Min guard         General transfer comment: increased time and effort, good technique, min guard for safety  Ambulation/Gait Ambulation/Gait assistance: Min guard Ambulation Distance (Feet): 200 Feet Assistive device: None Gait Pattern/deviations: Step-through pattern;Decreased stride length;Trunk flexed;Shuffle Gait velocity: decreased Gait velocity interpretation: Below normal speed for age/gender General Gait Details: verbal cueing for improved posture and for forward visual gaze with ambulation, mildly unsteady but no overt LOB or need for physical assistance, min guard for  safety   Stairs Stairs: Yes   Stair Management: One rail Right;Step to pattern;Forwards Number of Stairs: 4 General stair comments: mild instability but no overt LOB, min guard for safety. pt's spouse was present during stair training  Wheelchair Mobility    Modified Rankin (Stroke Patients Only)       Balance Overall balance assessment: Needs assistance Sitting-balance support: Feet supported;No upper extremity supported Sitting balance-Leahy Scale: Good     Standing balance support: No upper extremity supported;During functional activity Standing balance-Leahy Scale: Fair                              Cognition Arousal/Alertness: Awake/alert Behavior During Therapy: WFL for tasks assessed/performed Overall Cognitive Status: Within Functional Limits for tasks assessed                                        Exercises      General Comments        Pertinent Vitals/Pain Pain Assessment: Faces Pain Score: 8  Faces Pain Scale: Hurts even more Pain Location: back Pain Descriptors / Indicators: Aching;Grimacing;Guarding Pain Intervention(s): Monitored during session;Repositioned    Home Living Family/patient expects to be discharged to:: Private residence Living Arrangements: Spouse/significant other Available Help at Discharge: Family Type of Home: House Home Access: Stairs to enter Entrance Stairs-Rails: Can reach both Home Layout: One level Home Equipment: Other (comment);Grab bars - toilet(handles on toilet)      Prior Function Level of Independence: Independent          PT Goals (current goals can now be found in the care plan section)  Acute Rehab PT Goals Patient Stated Goal: to go home PT Goal Formulation: With patient/family Time For Goal Achievement: 09/12/17 Potential to Achieve Goals: Good Progress towards PT goals: Progressing toward goals    Frequency    Min 5X/week      PT Plan Current plan remains  appropriate    Co-evaluation              AM-PAC PT "6 Clicks" Daily Activity  Outcome Measure  Difficulty turning over in bed (including adjusting bedclothes, sheets and blankets)?: None Difficulty moving from lying on back to sitting on the side of the bed? : A Little Difficulty sitting down on and standing up from a chair with arms (e.g., wheelchair, bedside commode, etc,.)?: A Little Help needed moving to and from a bed to chair (including a wheelchair)?: A Little Help needed walking in hospital room?: A Little Help needed climbing 3-5 steps with a railing? : A Little 6 Click Score: 19    End of Session Equipment Utilized During Treatment: Back brace Activity Tolerance: Patient tolerated treatment well Patient left: in bed;with call bell/phone within reach;with family/visitor present;Other (comment)(sitting EOB) Nurse Communication: Mobility status PT Visit Diagnosis: Unsteadiness on feet (R26.81);Difficulty in walking, not elsewhere classified (R26.2)     Time: [id number] PT Time Calculation (min) (ACUTE ONLY): 11 min  Charges:  $Gait Training: 8-22 mins                    G Codes:       [place] BerrosJennifer Glorious Haynes, [place]PT, [place]PT [phone]    [name] 08/30/2017, 10:49 AM

## 2018-02-09 ENCOUNTER — Other Ambulatory Visit: Payer: Self-pay | Admitting: Neurosurgery

## 2018-02-09 DIAGNOSIS — M4317 Spondylolisthesis, lumbosacral region: Secondary | ICD-10-CM

## 2018-02-16 ENCOUNTER — Ambulatory Visit
Admission: RE | Admit: 2018-02-16 | Discharge: 2018-02-16 | Disposition: A | Discharge status: Home / Self Care | Payer: Medicare PPO | Source: Ambulatory Visit | Attending: Neurosurgery | Admitting: Neurosurgery

## 2018-02-16 DIAGNOSIS — M4317 Spondylolisthesis, lumbosacral region: Secondary | ICD-10-CM

## 2018-06-27 ENCOUNTER — Other Ambulatory Visit: Payer: Self-pay | Admitting: Neurosurgery

## 2018-06-27 DIAGNOSIS — M5416 Radiculopathy, lumbar region: Secondary | ICD-10-CM

## 2018-06-29 ENCOUNTER — Ambulatory Visit
Admission: RE | Admit: 2018-06-29 | Discharge: 2018-06-29 | Disposition: A | Discharge status: Home / Self Care | Payer: Medicare PPO | Source: Ambulatory Visit | Attending: Neurosurgery | Admitting: Neurosurgery

## 2018-06-29 DIAGNOSIS — M5416 Radiculopathy, lumbar region: Secondary | ICD-10-CM

## 2018-09-15 DIAGNOSIS — R42 Dizziness and giddiness: Secondary | ICD-10-CM

## 2018-09-15 DIAGNOSIS — H9113 Presbycusis, bilateral: Secondary | ICD-10-CM

## 2018-09-15 DIAGNOSIS — H8112 Benign paroxysmal vertigo, left ear: Secondary | ICD-10-CM | POA: Insufficient documentation

## 2018-09-15 HISTORY — DX: Presbycusis, bilateral: H91.13

## 2018-09-15 HISTORY — DX: Dizziness and giddiness: R42

## 2018-09-15 HISTORY — DX: Benign paroxysmal vertigo, left ear: H81.12

## 2020-03-16 DIAGNOSIS — L03114 Cellulitis of left upper limb: Secondary | ICD-10-CM | POA: Diagnosis not present

## 2020-03-16 DIAGNOSIS — S41152A Open bite of left upper arm, initial encounter: Secondary | ICD-10-CM | POA: Diagnosis not present

## 2020-03-16 DIAGNOSIS — W540XXA Bitten by dog, initial encounter: Secondary | ICD-10-CM | POA: Diagnosis not present

## 2020-07-10 DIAGNOSIS — I10 Essential (primary) hypertension: Secondary | ICD-10-CM | POA: Diagnosis not present

## 2020-07-10 DIAGNOSIS — R600 Localized edema: Secondary | ICD-10-CM | POA: Diagnosis not present

## 2020-07-10 DIAGNOSIS — E785 Hyperlipidemia, unspecified: Secondary | ICD-10-CM | POA: Diagnosis not present

## 2020-07-10 DIAGNOSIS — E1169 Type 2 diabetes mellitus with other specified complication: Secondary | ICD-10-CM | POA: Diagnosis not present

## 2020-11-03 DIAGNOSIS — Z961 Presence of intraocular lens: Secondary | ICD-10-CM | POA: Diagnosis not present

## 2020-11-03 DIAGNOSIS — H353131 Nonexudative age-related macular degeneration, bilateral, early dry stage: Secondary | ICD-10-CM | POA: Diagnosis not present

## 2020-11-03 DIAGNOSIS — H43813 Vitreous degeneration, bilateral: Secondary | ICD-10-CM | POA: Diagnosis not present

## 2020-12-04 DIAGNOSIS — M25422 Effusion, left elbow: Secondary | ICD-10-CM | POA: Diagnosis not present

## 2021-02-04 DIAGNOSIS — L578 Other skin changes due to chronic exposure to nonionizing radiation: Secondary | ICD-10-CM | POA: Diagnosis not present

## 2021-07-01 DIAGNOSIS — Y999 Unspecified external cause status: Secondary | ICD-10-CM | POA: Diagnosis not present

## 2021-07-01 DIAGNOSIS — S2232XA Fracture of one rib, left side, initial encounter for closed fracture: Secondary | ICD-10-CM | POA: Diagnosis not present

## 2021-07-01 DIAGNOSIS — S22059A Unspecified fracture of T5-T6 vertebra, initial encounter for closed fracture: Secondary | ICD-10-CM | POA: Diagnosis not present

## 2021-07-01 DIAGNOSIS — Z043 Encounter for examination and observation following other accident: Secondary | ICD-10-CM | POA: Diagnosis not present

## 2021-07-01 DIAGNOSIS — N200 Calculus of kidney: Secondary | ICD-10-CM | POA: Diagnosis not present

## 2021-07-01 DIAGNOSIS — M545 Low back pain, unspecified: Secondary | ICD-10-CM | POA: Diagnosis not present

## 2021-07-01 DIAGNOSIS — S3992XA Unspecified injury of lower back, initial encounter: Secondary | ICD-10-CM | POA: Diagnosis not present

## 2021-07-01 DIAGNOSIS — S0001XA Abrasion of scalp, initial encounter: Secondary | ICD-10-CM | POA: Diagnosis not present

## 2021-07-01 DIAGNOSIS — M2578 Osteophyte, vertebrae: Secondary | ICD-10-CM | POA: Diagnosis not present

## 2021-07-01 DIAGNOSIS — W1839XA Other fall on same level, initial encounter: Secondary | ICD-10-CM | POA: Diagnosis not present

## 2021-07-01 DIAGNOSIS — R296 Repeated falls: Secondary | ICD-10-CM | POA: Diagnosis not present

## 2021-07-01 DIAGNOSIS — S2242XA Multiple fractures of ribs, left side, initial encounter for closed fracture: Secondary | ICD-10-CM | POA: Diagnosis not present

## 2021-07-09 DIAGNOSIS — I10 Essential (primary) hypertension: Secondary | ICD-10-CM | POA: Diagnosis not present

## 2021-07-09 DIAGNOSIS — M546 Pain in thoracic spine: Secondary | ICD-10-CM | POA: Diagnosis not present

## 2021-07-09 DIAGNOSIS — M8008XA Age-related osteoporosis with current pathological fracture, vertebra(e), initial encounter for fracture: Secondary | ICD-10-CM | POA: Diagnosis not present

## 2021-08-05 DIAGNOSIS — H9113 Presbycusis, bilateral: Secondary | ICD-10-CM | POA: Diagnosis not present

## 2021-08-05 DIAGNOSIS — M545 Low back pain, unspecified: Secondary | ICD-10-CM | POA: Diagnosis not present

## 2021-08-05 DIAGNOSIS — M4726 Other spondylosis with radiculopathy, lumbar region: Secondary | ICD-10-CM | POA: Diagnosis not present

## 2021-08-05 DIAGNOSIS — M546 Pain in thoracic spine: Secondary | ICD-10-CM | POA: Diagnosis not present

## 2021-08-05 DIAGNOSIS — R42 Dizziness and giddiness: Secondary | ICD-10-CM | POA: Diagnosis not present

## 2021-08-05 DIAGNOSIS — M961 Postlaminectomy syndrome, not elsewhere classified: Secondary | ICD-10-CM | POA: Diagnosis not present

## 2021-08-05 DIAGNOSIS — M8008XA Age-related osteoporosis with current pathological fracture, vertebra(e), initial encounter for fracture: Secondary | ICD-10-CM | POA: Diagnosis not present

## 2021-08-05 DIAGNOSIS — M48062 Spinal stenosis, lumbar region with neurogenic claudication: Secondary | ICD-10-CM | POA: Diagnosis not present

## 2021-08-10 ENCOUNTER — Other Ambulatory Visit: Payer: Self-pay | Admitting: Orthopedic Surgery

## 2021-08-10 DIAGNOSIS — M48062 Spinal stenosis, lumbar region with neurogenic claudication: Secondary | ICD-10-CM

## 2021-08-10 DIAGNOSIS — M4326 Fusion of spine, lumbar region: Secondary | ICD-10-CM

## 2021-08-18 DIAGNOSIS — A881 Epidemic vertigo: Secondary | ICD-10-CM | POA: Diagnosis not present

## 2021-08-18 DIAGNOSIS — R2681 Unsteadiness on feet: Secondary | ICD-10-CM | POA: Diagnosis not present

## 2021-08-18 DIAGNOSIS — R42 Dizziness and giddiness: Secondary | ICD-10-CM | POA: Diagnosis not present

## 2021-08-18 DIAGNOSIS — R262 Difficulty in walking, not elsewhere classified: Secondary | ICD-10-CM | POA: Diagnosis not present

## 2021-08-25 DIAGNOSIS — R262 Difficulty in walking, not elsewhere classified: Secondary | ICD-10-CM | POA: Diagnosis not present

## 2021-08-25 DIAGNOSIS — A881 Epidemic vertigo: Secondary | ICD-10-CM | POA: Diagnosis not present

## 2021-08-25 DIAGNOSIS — R42 Dizziness and giddiness: Secondary | ICD-10-CM | POA: Diagnosis not present

## 2021-08-25 DIAGNOSIS — R2681 Unsteadiness on feet: Secondary | ICD-10-CM | POA: Diagnosis not present

## 2021-08-29 ENCOUNTER — Other Ambulatory Visit: Payer: Self-pay

## 2021-08-29 ENCOUNTER — Ambulatory Visit
Admission: RE | Admit: 2021-08-29 | Discharge: 2021-08-29 | Disposition: A | Discharge status: Home / Self Care | Payer: Medicare PPO | Source: Ambulatory Visit | Attending: Orthopedic Surgery | Admitting: Orthopedic Surgery

## 2021-08-29 DIAGNOSIS — M4326 Fusion of spine, lumbar region: Secondary | ICD-10-CM

## 2021-08-29 DIAGNOSIS — M545 Low back pain, unspecified: Secondary | ICD-10-CM | POA: Diagnosis not present

## 2021-08-29 DIAGNOSIS — M48061 Spinal stenosis, lumbar region without neurogenic claudication: Secondary | ICD-10-CM | POA: Diagnosis not present

## 2021-08-29 DIAGNOSIS — M48062 Spinal stenosis, lumbar region with neurogenic claudication: Secondary | ICD-10-CM

## 2021-08-30 DIAGNOSIS — A881 Epidemic vertigo: Secondary | ICD-10-CM | POA: Diagnosis not present

## 2021-08-30 DIAGNOSIS — R262 Difficulty in walking, not elsewhere classified: Secondary | ICD-10-CM | POA: Diagnosis not present

## 2021-08-30 DIAGNOSIS — R42 Dizziness and giddiness: Secondary | ICD-10-CM | POA: Diagnosis not present

## 2021-08-30 DIAGNOSIS — R2681 Unsteadiness on feet: Secondary | ICD-10-CM | POA: Diagnosis not present

## 2021-09-02 DIAGNOSIS — M48062 Spinal stenosis, lumbar region with neurogenic claudication: Secondary | ICD-10-CM | POA: Diagnosis not present

## 2021-09-02 DIAGNOSIS — M4726 Other spondylosis with radiculopathy, lumbar region: Secondary | ICD-10-CM | POA: Diagnosis not present

## 2021-09-02 DIAGNOSIS — M961 Postlaminectomy syndrome, not elsewhere classified: Secondary | ICD-10-CM | POA: Diagnosis not present

## 2021-09-10 DIAGNOSIS — E1169 Type 2 diabetes mellitus with other specified complication: Secondary | ICD-10-CM | POA: Diagnosis not present

## 2021-09-10 DIAGNOSIS — E785 Hyperlipidemia, unspecified: Secondary | ICD-10-CM | POA: Diagnosis not present

## 2021-09-10 DIAGNOSIS — R42 Dizziness and giddiness: Secondary | ICD-10-CM | POA: Diagnosis not present

## 2021-09-10 DIAGNOSIS — R001 Bradycardia, unspecified: Secondary | ICD-10-CM | POA: Diagnosis not present

## 2021-09-10 DIAGNOSIS — I10 Essential (primary) hypertension: Secondary | ICD-10-CM | POA: Diagnosis not present

## 2021-09-13 ENCOUNTER — Encounter: Payer: Self-pay | Admitting: Cardiology

## 2021-09-13 ENCOUNTER — Encounter: Payer: Self-pay | Admitting: *Deleted

## 2021-09-13 DIAGNOSIS — N401 Enlarged prostate with lower urinary tract symptoms: Secondary | ICD-10-CM | POA: Insufficient documentation

## 2021-09-13 DIAGNOSIS — R972 Elevated prostate specific antigen [PSA]: Secondary | ICD-10-CM | POA: Insufficient documentation

## 2021-09-13 DIAGNOSIS — E1169 Type 2 diabetes mellitus with other specified complication: Secondary | ICD-10-CM | POA: Insufficient documentation

## 2021-09-13 DIAGNOSIS — M109 Gout, unspecified: Secondary | ICD-10-CM | POA: Insufficient documentation

## 2021-09-13 DIAGNOSIS — H9193 Unspecified hearing loss, bilateral: Secondary | ICD-10-CM | POA: Insufficient documentation

## 2021-09-13 DIAGNOSIS — E785 Hyperlipidemia, unspecified: Secondary | ICD-10-CM | POA: Insufficient documentation

## 2021-09-13 DIAGNOSIS — F410 Panic disorder [episodic paroxysmal anxiety] without agoraphobia: Secondary | ICD-10-CM | POA: Insufficient documentation

## 2021-09-13 DIAGNOSIS — M1990 Unspecified osteoarthritis, unspecified site: Secondary | ICD-10-CM | POA: Insufficient documentation

## 2021-09-16 NOTE — Progress Notes (Signed)
Cardiology Office Note:    Date:  09/17/2021   ID:  Larry Haynes, DOB [birth year], MRN [id number]  PCP:  Paulina Fusi, MD  Cardiologist:  Norman Herrlich, MD   Referring MD: Paulina Fusi, MD  ASSESSMENT:    1. Near syncope   2. Sinus bradycardia   3. Nonspecific abnormal electrocardiogram (ECG) (EKG)   4. Essential hypertension    PLAN:    In order of problems listed above:  His clinical presentation with what I would describe best is near syncope in the setting of sinus bradycardia quite suggestive of symptomatic pauses today I will apply a live monitor if unremarkable over 2 weeks we will repeat her he may require an implanted loop recorder and also check an echocardiogram with prominent U waves on the EKG to exclude any unrecognized heart disease or cardiomyopathy. 2.   Stable hypertension continue his current medical treatment he is on no rate slowing medications  Next appointment 6 weeks   Medication Adjustments/Labs and Tests Ordered: Current medicines are reviewed at length with the patient today.  Concerns regarding medicines are outlined above.  Orders Placed This Encounter  Procedures   EKG 12-Lead    No orders of the defined types were placed in this encounter.     Chief complaint: I have a long history of relatively slow heart rate I have vertigo in the past but recently has had several episodes of what is best described as near syncope with trauma including vertebral fracture and rib fracture.  These episodes are not vertigo.  History of Present Illness:    Larry Haynes is a 75 y.o. male with a history of hypertension type 2 diabetes and hyper lipidemia who is being seen today for the evaluation of bradycardia at the request of Paulina Fusi, MD.  At recent office visit PCP 09/12/2021 he had dizziness and resting heart rate 45 bpm. Laboratory studies show cholesterol 212 LDL 144 triglycerides 54 HDL 58 Hemoglobin 15.3 platelets  256,000 A1c 6.1% Creatinine 1.09 GFR 70 cc/min sodium 143 potassium 4.3 EKG 09/10/2021 independently reviewed sinus rhythm 45 bpm is very prominent U waves on the EKG first-degree AV block PR interval 233 ms QT interval is normal QRS morphology otherwise normal.  I know his wife well life cared for her for well over a decade with cardiomyopathy and she is present today He has had 2 or 3 episodes where he feels as if he would faint he had collapse and has had trauma. EMS came to site on the last visit did not do an EKG and he declined to go the emergency room. He does not have an orthostatic shift and blood pressure but he is lightheaded when he stands and bends over. No palpitation chest pain shortness of breath or edema no known heart disease but his previous EKG showed very prominent U waves typically seen in severe hypokalemia or left ventricular hypertrophy. Has no known history of heart disease congenital rheumatic coronary or atrial fibrillation Past Medical History:  Diagnosis Date   Arthritis    Benign paroxysmal positional vertigo of left ear 09/15/2018   Benign prostatic hyperplasia with lower urinary tract symptoms    Bilateral hearing loss    Dizziness 09/15/2018   DM type 2 with diabetic dyslipidemia (HCC)    Elevated PSA    Essential hypertension 11/13/2015   Exertional headache 11/13/2015   Gout    Hyperkeratotic oral lesion 07/15/2015   Hyperlipemia    Hypertension  Panic attacks    Post-concussion headache    Presbycusis of both ears 09/15/2018   Spondylolisthesis at L5-S1 level 08/29/2017    Past Surgical History:  Procedure Laterality Date   CATARACT EXTRACTION Bilateral 2015   SKIN CANCER EXCISION     BOTTOM  LIP   SPINAL FUSION  08/29/2017   L5-S1 decompression and fusion by Julio Sicks   TONSILLECTOMY  1952   TOTAL KNEE ARTHROPLASTY Left 2010    Current Medications: Current Meds  Medication Sig   amLODipine (NORVASC) 10 MG tablet Take 10 mg daily by  mouth.    doxazosin (CARDURA) 2 MG tablet Take 2 mg by mouth at bedtime.   furosemide (LASIX) 40 MG tablet Take 40 mg by mouth daily.   glimepiride (AMARYL) 1 MG tablet Take 1 mg by mouth daily.   hydrALAZINE (APRESOLINE) 50 MG tablet Take 50 mg by mouth 3 (three) times daily.   sertraline (ZOLOFT) 50 MG tablet Take 50 mg by mouth daily.   tamsulosin (FLOMAX) 0.4 MG CAPS capsule Take 0.4 mg by mouth daily.     Allergies:   Ace inhibitors, Avelox [moxifloxacin], Losartan potassium, Percocet [oxycodone-acetaminophen], Other, and Prednisone   Social History   Socioeconomic History   Marital status: Married    Spouse name: Not on file   Number of children: Not on file   Years of education: Not on file   Highest education level: Not on file  Occupational History   Not on file  Tobacco Use   Smoking status: Never    Passive exposure: Never   Smokeless tobacco: Never  Vaping Use   Vaping Use: Never used  Substance and Sexual Activity   Alcohol use: No   Drug use: No   Sexual activity: Not on file  Other Topics Concern   Not on file  Social History Narrative   Not on file   Social Determinants of Health   Financial Resource Strain: Not on file  Food Insecurity: Not on file  Transportation Needs: Not on file  Physical Activity: Not on file  Stress: Not on file  Social Connections: Not on file     Family History: The patient's family history includes Diabetes in his father; Heart disease in his mother; Hypertension in his mother; Prostate cancer in his father.  ROS:   ROS Please see the history of present illness.     All other systems reviewed and are negative.  EKGs/Labs/Other Studies Reviewed:    The following studies were reviewed today:   EKG:  EKG is  ordered today.  The ekg ordered today is personally reviewed and demonstrates sinus rhythm first-degree AV block left anterior hemiblock nonspecific ST change U waves are not prominent today  Recent Labs: See  history   Physical Exam:    VS:  BP (!) 146/72    Pulse 63    Ht 5\' 7"  (1.702 m)    Wt 246 lb (111.6 kg)    SpO2 96%    BMI 38.53 kg/m     Wt Readings from Last 3 Encounters:  09/17/21 246 lb (111.6 kg)  09/10/21 248 lb (112.5 kg)  08/29/17 246 lb (111.6 kg)     GEN:  Well nourished, well developed in no acute distress HEENT: Normal NECK: No JVD; No carotid bruits LYMPHATICS: No lymphadenopathy CARDIAC: RRR, no murmurs, rubs, gallops RESPIRATORY:  Clear to auscultation without rales, wheezing or rhonchi  ABDOMEN: Soft, non-tender, non-distended MUSCULOSKELETAL:  No edema; No deformity  SKIN:  Warm and dry NEUROLOGIC:  Alert and oriented x 3 PSYCHIATRIC:  Normal affect     Signed, Norman Herrlich, MD  09/17/2021 9:03 AM    [facility]

## 2021-09-17 ENCOUNTER — Ambulatory Visit (INDEPENDENT_AMBULATORY_CARE_PROVIDER_SITE_OTHER): Payer: Medicare Other

## 2021-09-17 ENCOUNTER — Encounter: Payer: Self-pay | Admitting: Cardiology

## 2021-09-17 ENCOUNTER — Ambulatory Visit: Payer: Medicare Other | Admitting: Cardiology

## 2021-09-17 ENCOUNTER — Other Ambulatory Visit: Payer: Self-pay

## 2021-09-17 VITALS — BP 146/72 | HR 63 | Ht 67.0 in | Wt 246.0 lb

## 2021-09-17 DIAGNOSIS — I10 Essential (primary) hypertension: Secondary | ICD-10-CM

## 2021-09-17 DIAGNOSIS — R001 Bradycardia, unspecified: Secondary | ICD-10-CM | POA: Diagnosis not present

## 2021-09-17 DIAGNOSIS — R9431 Abnormal electrocardiogram [ECG] [EKG]: Secondary | ICD-10-CM | POA: Diagnosis not present

## 2021-09-17 DIAGNOSIS — R55 Syncope and collapse: Secondary | ICD-10-CM

## 2021-09-17 NOTE — Patient Instructions (Signed)
Medication Instructions:  Your physician recommends that you continue on your current medications as directed. Please refer to the Current Medication list given to you today.  *If you need a refill on your cardiac medications before your next appointment, please call your pharmacy*   Lab Work: None If you have labs (blood work) drawn today and your tests are completely normal, you will receive your results only by: . MyChart Message (if you have MyChart) OR . A paper copy in the mail If you have any lab test that is abnormal or we need to change your treatment, we will call you to review the results.   Testing/Procedures: Your physician has requested that you have an echocardiogram. Echocardiography is a painless test that uses sound waves to create images of your heart. It provides your doctor with information about the size and shape of your heart and how well your heart's chambers and valves are working. This procedure takes approximately one hour. There are no restrictions for this procedure.  A zio monitor was ordered today. It will remain on for 14 days. You will then return monitor and event diary in provided box. It takes 1-2 weeks for report to be downloaded and returned to us. We will call you with the results. If monitor falls off or has orange flashing light, please call Zio for further instructions.      Follow-Up: At [facility], you and your health needs are our priority.  As part of our continuing mission to provide you with exceptional heart care, we have created designated Provider Care Teams.  These Care Teams include your primary Cardiologist (physician) and Advanced Practice Providers (APPs -  Physician Assistants and Nurse Practitioners) who all work together to provide you with the care you need, when you need it.  We recommend signing up for the patient portal called "MyChart".  Sign up information is provided on this After Visit Summary.  MyChart is used to  connect with patients for Virtual Visits (Telemedicine).  Patients are able to view lab/test results, encounter notes, upcoming appointments, etc.  Non-urgent messages can be sent to your provider as well.   To learn more about what you can do with MyChart, go to [email or web address].    Your next appointment:   6 week(s)  The format for your next appointment:   In Person  Provider:   Brian Munley, MD   Other Instructions   

## 2021-09-18 DIAGNOSIS — R55 Syncope and collapse: Secondary | ICD-10-CM | POA: Diagnosis not present

## 2021-09-30 DIAGNOSIS — R55 Syncope and collapse: Secondary | ICD-10-CM

## 2021-10-05 ENCOUNTER — Ambulatory Visit (INDEPENDENT_AMBULATORY_CARE_PROVIDER_SITE_OTHER): Payer: Medicare Other

## 2021-10-05 ENCOUNTER — Other Ambulatory Visit: Payer: Self-pay

## 2021-10-05 DIAGNOSIS — R55 Syncope and collapse: Secondary | ICD-10-CM | POA: Diagnosis not present

## 2021-10-05 DIAGNOSIS — R9431 Abnormal electrocardiogram [ECG] [EKG]: Secondary | ICD-10-CM

## 2021-10-05 LAB — ECHOCARDIOGRAM COMPLETE
Area-P 1/2: 2.97 cm2
S' Lateral: 3.1 cm

## 2021-10-08 ENCOUNTER — Telehealth: Payer: Self-pay

## 2021-10-08 NOTE — Telephone Encounter (Signed)
Tried calling patient. No answer and no voicemail set up for me to leave a message. 

## 2021-10-08 NOTE — Telephone Encounter (Signed)
-----   Message from Baldo Daub, MD sent at 10/08/2021  8:18 AM EST ----- The monitor showed 1 pause in heartbeat 3.6 seconds during the daytime generally this would not cause loss of consciousness but I concerned that he may be having episodes that we have not documented I think he should have an implanted loop recorder I like him to be seen by EP.

## 2021-10-11 ENCOUNTER — Telehealth: Payer: Self-pay

## 2021-10-11 DIAGNOSIS — I455 Other specified heart block: Secondary | ICD-10-CM

## 2021-10-11 DIAGNOSIS — R42 Dizziness and giddiness: Secondary | ICD-10-CM | POA: Diagnosis not present

## 2021-10-11 DIAGNOSIS — R55 Syncope and collapse: Secondary | ICD-10-CM | POA: Diagnosis not present

## 2021-10-11 DIAGNOSIS — R001 Bradycardia, unspecified: Secondary | ICD-10-CM | POA: Diagnosis not present

## 2021-10-11 NOTE — Telephone Encounter (Signed)
Spoke with patient regarding results and recommendation.  Patient verbalizes understanding and is agreeable to plan of care. Advised patient to call back with any issues or concerns.  

## 2021-10-11 NOTE — Telephone Encounter (Signed)
-----   Message from Senaida Ores, RN sent at 10/08/2021  9:20 AM EST -----  ----- Message ----- From: Richardo Priest, MD Sent: 10/08/2021   8:18 AM EST To: [facility] Triage  The monitor showed 1 pause in heartbeat 3.6 seconds during the daytime generally this would not cause loss of consciousness but I concerned that he may be having episodes that we have not documented I think he should have an implanted loop recorder I like him to be seen by EP.

## 2021-10-14 DIAGNOSIS — M47816 Spondylosis without myelopathy or radiculopathy, lumbar region: Secondary | ICD-10-CM | POA: Diagnosis not present

## 2021-10-14 DIAGNOSIS — M961 Postlaminectomy syndrome, not elsewhere classified: Secondary | ICD-10-CM | POA: Diagnosis not present

## 2021-10-14 DIAGNOSIS — I10 Essential (primary) hypertension: Secondary | ICD-10-CM | POA: Diagnosis not present

## 2021-10-14 DIAGNOSIS — M48062 Spinal stenosis, lumbar region with neurogenic claudication: Secondary | ICD-10-CM | POA: Diagnosis not present

## 2021-10-25 DIAGNOSIS — M48062 Spinal stenosis, lumbar region with neurogenic claudication: Secondary | ICD-10-CM | POA: Diagnosis not present

## 2021-10-30 DIAGNOSIS — J069 Acute upper respiratory infection, unspecified: Secondary | ICD-10-CM | POA: Diagnosis not present

## 2021-11-01 ENCOUNTER — Ambulatory Visit: Payer: Medicare Other | Admitting: Cardiology

## 2021-11-01 ENCOUNTER — Encounter: Payer: Self-pay | Admitting: Cardiology

## 2021-11-01 ENCOUNTER — Other Ambulatory Visit: Payer: Self-pay

## 2021-11-01 VITALS — BP 160/58 | HR 46 | Ht 67.0 in | Wt 246.2 lb

## 2021-11-01 DIAGNOSIS — R001 Bradycardia, unspecified: Secondary | ICD-10-CM | POA: Diagnosis not present

## 2021-11-01 NOTE — Progress Notes (Signed)
Electrophysiology Office Note   Date:  11/01/2021   ID:  Larry Haynes, DOB [birth year], MRN [id number]  PCP:  Nicoletta Dress, MD  Cardiologist: Bettina Gavia Primary Electrophysiologist:  Arlethia Basso Meredith Leeds, MD    Chief Complaint: Dizziness   History of Present Illness: AQUILES ABRESCH is a 76 y.o. male who is being seen today for the evaluation of dizziness at the request of Bettina Gavia, Hilton Cork, MD. Presenting today for electrophysiology evaluation.  He has a history significant for type 2 diabetes, hypertension, hyperlipidemia.  He has been having episodes of dizziness.  His dizziness occurs mainly when he goes from a seated to a standing position.  He can stand for a few seconds and is able to move on.  He does not have palpitations or shortness of breath when he has these episodes.  He otherwise has fatigue, but no other complaints at this time.  He did have an episode of syncope, but this was 4- 5 years ago and he had a negative work-up.  His wife states that he is fatigued throughout the day.  He snores at night and sometimes gasps while sleeping.  He falls asleep quite easily throughout the day.  Today, he denies symptoms of palpitations, chest pain, shortness of breath, orthopnea, PND, lower extremity edema, claudication,  presyncope, syncope, bleeding, or neurologic sequela. The patient is tolerating medications without difficulties.    Past Medical History:  Diagnosis Date   Arthritis    Benign paroxysmal positional vertigo of left ear 09/15/2018   Benign prostatic hyperplasia with lower urinary tract symptoms    Bilateral hearing loss    Dizziness 09/15/2018   DM type 2 with diabetic dyslipidemia (HCC)    Elevated PSA    Essential hypertension 11/13/2015   Exertional headache 11/13/2015   Gout    Hyperkeratotic oral lesion 07/15/2015   Hyperlipemia    Hypertension    Panic attacks    Post-concussion headache    Presbycusis of both ears 09/15/2018   Spondylolisthesis at  L5-S1 level 08/29/2017   Past Surgical History:  Procedure Laterality Date   CATARACT EXTRACTION Bilateral 2015   SKIN CANCER EXCISION     BOTTOM  LIP   SPINAL FUSION  08/29/2017   L5-S1 decompression and fusion by [place] Left 2010     Current Outpatient Medications  Medication Sig Dispense Refill   amLODipine (NORVASC) 10 MG tablet Take 10 mg daily by mouth.      amoxicillin (AMOXIL) 500 MG capsule Take 500 mg by mouth 3 (three) times daily.     doxazosin (CARDURA) 2 MG tablet Take 2 mg by mouth at bedtime.     furosemide (LASIX) 40 MG tablet Take 40 mg by mouth daily.     glimepiride (AMARYL) 1 MG tablet Take 1 mg by mouth daily.     hydrALAZINE (APRESOLINE) 50 MG tablet Take 50 mg by mouth 3 (three) times daily.     rosuvastatin (CRESTOR) 10 MG tablet Take 10 mg by mouth daily.     sertraline (ZOLOFT) 50 MG tablet Take 50 mg by mouth daily.     tamsulosin (FLOMAX) 0.4 MG CAPS capsule Take 0.4 mg by mouth daily.     No current facility-administered medications for this visit.    Allergies:   Ace inhibitors, Avelox [moxifloxacin], Losartan potassium, Percocet [oxycodone-acetaminophen], Other, and Prednisone   Social History:  The patient  reports that he has never  smoked. He has never been exposed to tobacco smoke. He has never used smokeless tobacco. He reports that he does not drink alcohol and does not use drugs.   Family History:  The patient's family history includes Diabetes in his father; Heart disease in his mother; Hypertension in his mother; Prostate cancer in his father.    ROS:  Please see the history of present illness.   Otherwise, review of systems is positive for none.   All other systems are reviewed and negative.    PHYSICAL EXAM: VS:  BP (!) 160/58    Pulse (!) 46    Ht 5\' 7"  (1.702 m)    Wt 246 lb 3.2 oz (111.7 kg)    SpO2 94%    BMI 38.56 kg/m  , BMI Body mass index is 38.56 kg/m. GEN: Well nourished,  well developed, in no acute distress  HEENT: normal  Neck: no JVD, carotid bruits, or masses Cardiac: RRR; no murmurs, rubs, or gallops,no edema  Respiratory:  clear to auscultation bilaterally, normal work of breathing GI: soft, nontender, nondistended, + BS MS: no deformity or atrophy  Skin: warm and dry Neuro:  Strength and sensation are intact Psych: euthymic mood, full affect  EKG:  EKG is ordered today. Personal review of the ekg ordered shows sinus rhythm   Recent Labs: No results found for requested labs within last 8760 hours.    Lipid Panel  No results found for: CHOL, TRIG, HDL, CHOLHDL, VLDL, LDLCALC, LDLDIRECT   Wt Readings from Last 3 Encounters:  11/01/21 246 lb 3.2 oz (111.7 kg)  09/17/21 246 lb (111.6 kg)  09/10/21 248 lb (112.5 kg)      Other studies Reviewed: Additional studies/ records that were reviewed today include: TTE 10/05/21  Review of the above records today demonstrates:   1. Left ventricular ejection fraction, by estimation, is 60 to 65%. The  left ventricle has normal function. The left ventricle has no regional  wall motion abnormalities. There is mild left ventricular hypertrophy.  Left ventricular diastolic parameters  are consistent with Grade II diastolic dysfunction (pseudonormalization).   2. Right ventricular systolic function is normal. The right ventricular  size is normal. There is normal pulmonary artery systolic pressure.   3. The mitral valve is normal in structure. Mild mitral valve  regurgitation. No evidence of mitral stenosis.   4. The aortic valve is normal in structure. Aortic valve regurgitation is  not visualized. No aortic stenosis is present.   5. The inferior vena cava is normal in size with greater than 50%  respiratory variability, suggesting right atrial pressure of 3 mmHg.   Cardiac monitor 10/08/2021 personally reviewed There was 1 sinus pause noted in the early evening hours 7:13 PM 3.6 seconds.  Sinus  bradycardia in the 30s occurred nocturnally   Ventricular ectopy was rare supraventricular ectopy was rare there were 4 brief runs of APCs the longest 17 complexes at a rate of 111 bpm   ASSESSMENT AND PLAN:  1.  Dizziness: Patient dizziness occurs when he stands up from a seated position.  He also gets dizzy when he stands from a laying position.  He has episodes of dizziness sound orthostatic in nature.  I told him to hydrate.  He Xzayvion Vaeth try this and get back to [place] if this does not make a difference.  2.  Sinus bradycardia: Patient has fatigue throughout the day.  He falls asleep quite quickly when he is sitting in a chair, and his wife  states that he is fatigued during most of the day.  Despite that, he does not want a pacemaker or monitor implanted.  We Damon Hargrove continue to monitor.  3.  Fatigue: Patient is obese.  He snores at night and gasps for breath.  He is certainly at risk of obstructive sleep apnea.  I have offered him a sleep study but he has refused.  Case discussed with primary cardiology.    Current medicines are reviewed at length with the patient today.   The patient does not have concerns regarding his medicines.  The following changes were made today:  none  Labs/ tests ordered today include:  Orders Placed This Encounter  Procedures   EKG 12-Lead     Disposition:   FU with Colson Barco as needed d  Signed, Simranjit Thayer Meredith Leeds, MD  11/01/2021 10:48 AM     [place]  [phone] (office) [phone] (fax)

## 2021-11-04 ENCOUNTER — Ambulatory Visit: Payer: Medicare Other | Admitting: Cardiology

## 2021-11-15 DIAGNOSIS — M48062 Spinal stenosis, lumbar region with neurogenic claudication: Secondary | ICD-10-CM | POA: Diagnosis not present

## 2021-11-15 DIAGNOSIS — M47816 Spondylosis without myelopathy or radiculopathy, lumbar region: Secondary | ICD-10-CM | POA: Diagnosis not present

## 2021-11-24 DIAGNOSIS — M47816 Spondylosis without myelopathy or radiculopathy, lumbar region: Secondary | ICD-10-CM | POA: Diagnosis not present

## 2021-12-07 DIAGNOSIS — M47816 Spondylosis without myelopathy or radiculopathy, lumbar region: Secondary | ICD-10-CM | POA: Diagnosis not present

## 2021-12-16 DIAGNOSIS — L508 Other urticaria: Secondary | ICD-10-CM | POA: Diagnosis not present

## 2022-01-04 DIAGNOSIS — M47816 Spondylosis without myelopathy or radiculopathy, lumbar region: Secondary | ICD-10-CM | POA: Diagnosis not present

## 2022-01-18 DIAGNOSIS — M47816 Spondylosis without myelopathy or radiculopathy, lumbar region: Secondary | ICD-10-CM | POA: Diagnosis not present

## 2022-02-21 DIAGNOSIS — I10 Essential (primary) hypertension: Secondary | ICD-10-CM | POA: Diagnosis not present

## 2022-02-21 DIAGNOSIS — R600 Localized edema: Secondary | ICD-10-CM | POA: Diagnosis not present

## 2022-02-21 DIAGNOSIS — L508 Other urticaria: Secondary | ICD-10-CM | POA: Diagnosis not present

## 2022-02-24 DIAGNOSIS — R42 Dizziness and giddiness: Secondary | ICD-10-CM | POA: Diagnosis not present

## 2022-03-22 ENCOUNTER — Encounter: Payer: Self-pay | Admitting: *Deleted

## 2022-03-24 ENCOUNTER — Ambulatory Visit: Payer: Medicare Other | Admitting: Diagnostic Neuroimaging

## 2022-03-24 ENCOUNTER — Encounter: Payer: Self-pay | Admitting: Diagnostic Neuroimaging

## 2022-03-24 VITALS — BP 185/77 | HR 45 | Ht 68.0 in | Wt 245.8 lb

## 2022-03-24 DIAGNOSIS — R269 Unspecified abnormalities of gait and mobility: Secondary | ICD-10-CM

## 2022-03-24 NOTE — Patient Instructions (Signed)
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

## 2022-03-24 NOTE — Progress Notes (Unsigned)
[facility]  PATIENT: Larry Haynes DOB: 1946-01-14  REFERRING CLINICIAN: Nicoletta Dress, MD HISTORY FROM: *** REASON FOR VISIT: ***   HISTORICAL  CHIEF COMPLAINT:  Chief Complaint  Patient presents with   Vestibular ataxia    Rm 6 New Pt  wife- Larry Haynes  "have fallen, uses walker and cane; no balance; MRI showed blockages"    HISTORY OF PRESENT ILLNESS:   ***  REVIEW OF SYSTEMS: Full 14 system review of systems performed and negative with exception of: ***  ALLERGIES: Allergies  Allergen Reactions   Ace Inhibitors Cough   Avelox [Moxifloxacin]    Losartan Potassium Cough   Percocet [Oxycodone-Acetaminophen]    Other Hives    Hot fudge   Prednisone Other (See Comments)    hyperactivity    HOME MEDICATIONS: Outpatient Medications Prior to Visit  Medication Sig Dispense Refill   amLODipine (NORVASC) 10 MG tablet Take 10 mg daily by mouth.      aspirin EC 81 MG tablet Take 81 mg by mouth daily. Swallow whole.     doxazosin (CARDURA) 2 MG tablet Take 2 mg by mouth at bedtime.     furosemide (LASIX) 40 MG tablet Take 40 mg by mouth daily.     glimepiride (AMARYL) 1 MG tablet Take 1 mg by mouth daily.     hydrALAZINE (APRESOLINE) 50 MG tablet Take 50 mg by mouth 3 (three) times daily.     hydrOXYzine (ATARAX) 25 MG tablet Take 25 mg by mouth 4 (four) times daily as needed.     loratadine (CLARITIN) 10 MG tablet Take 10 mg by mouth daily.     rosuvastatin (CRESTOR) 10 MG tablet Take 10 mg by mouth daily.     sertraline (ZOLOFT) 100 MG tablet Take 100 mg by mouth daily.     tamsulosin (FLOMAX) 0.4 MG CAPS capsule Take 0.4 mg by mouth daily.     sertraline (ZOLOFT) 50 MG tablet Take 50 mg by mouth daily.     amoxicillin (AMOXIL) 500 MG capsule Take 500 mg by mouth 3 (three) times daily.     No facility-administered medications prior to visit.    PAST MEDICAL HISTORY: Past Medical History:  Diagnosis Date   Arthritis    Benign paroxysmal  positional vertigo of left ear 09/15/2018   Benign prostatic hyperplasia with lower urinary tract symptoms    Bilateral hearing loss    Dizziness 09/15/2018   DM type 2 with diabetic dyslipidemia (HCC)    Elevated PSA    Essential hypertension 11/13/2015   Exertional headache 11/13/2015   Gout    Hyperkeratotic oral lesion 07/15/2015   Hyperlipemia    Hypertension    Panic attacks    Post-concussion headache    Presbycusis of both ears 09/15/2018   Spondylolisthesis at L5-S1 level 08/29/2017    PAST SURGICAL HISTORY: Past Surgical History:  Procedure Laterality Date   CATARACT EXTRACTION Bilateral 2015   SKIN CANCER EXCISION     BOTTOM  LIP   SPINAL FUSION  08/29/2017   L5-S1 decompression and fusion by [place]   TOTAL KNEE ARTHROPLASTY Left 2010    FAMILY HISTORY: Family History  Problem Relation Age of Onset   Hypertension Mother    Heart disease Mother    Prostate cancer Father    Diabetes Father     SOCIAL HISTORY: Social History   Socioeconomic History   Marital status: Married    Spouse name: Larry Haynes  Number of children: 2   Years of education: 12   Highest education level: Not on file  Occupational History    Comment: retired  Tobacco Use   Smoking status: Never    Passive exposure: Never   Smokeless tobacco: Never  Vaping Use   Vaping Use: Never used  Substance and Sexual Activity   Alcohol use: Never   Drug use: Never   Sexual activity: Not on file  Other Topics Concern   Not on file  Social History Narrative   Lives with wife   No caffeine   Social Determinants of Health   Financial Resource Strain: Not on file  Food Insecurity: Not on file  Transportation Needs: Not on file  Physical Activity: Not on file  Stress: Not on file  Social Connections: Not on file  Intimate Partner Violence: Not on file     PHYSICAL EXAM  GENERAL EXAM/CONSTITUTIONAL: Vitals:  Vitals:   03/24/22 1356  BP: (!) 185/77   Pulse: (!) 45  Weight: 245 lb 12.8 oz (111.5 kg)  Height: 5\' 8"  (1.727 m)   Body mass index is 37.37 kg/m. Wt Readings from Last 3 Encounters:  03/24/22 245 lb 12.8 oz (111.5 kg)  11/01/21 246 lb 3.2 oz (111.7 kg)  09/17/21 246 lb (111.6 kg)   Patient is in no distress; well developed, nourished and groomed; neck is supple  CARDIOVASCULAR: Examination of carotid arteries is normal; no carotid bruits Regular rate and rhythm, no murmurs Examination of peripheral vascular system by observation and palpation is normal  EYES: Ophthalmoscopic exam of optic discs and posterior segments is normal; no papilledema or hemorrhages No results found.  MUSCULOSKELETAL: Gait, strength, tone, movements noted in Neurologic exam below  NEUROLOGIC: MENTAL STATUS:      No data to display         awake, alert, oriented to person, place and time recent and remote memory intact normal attention and concentration language fluent, comprehension intact, naming intact fund of knowledge appropriate  CRANIAL NERVE:  2nd - no papilledema on fundoscopic exam 2nd, 3rd, 4th, 6th - pupils equal and reactive to light, visual fields full to confrontation, extraocular muscles intact, no nystagmus 5th - facial sensation symmetric 7th - facial strength symmetric 8th - hearing intact 9th - palate elevates symmetrically, uvula midline 11th - shoulder shrug symmetric 12th - tongue protrusion midline  MOTOR:  normal bulk and tone, full strength in the BUE, BLE NO BRADYKINESIA NO RIGIDITY  SENSORY:  normal and symmetric to light touch, pinprick, temperature, vibration; EXCEPT DECR IN FEET  COORDINATION:  finger-nose-finger, fine finger movements normal  REFLEXES:  deep tendon reflexes TRACE and symmetric  GAIT/STATION:  narrow based gait; STOOPED POSTURE; SLOW TO STAND; SHORT STEPS; EN BLOC TURNING     DIAGNOSTIC DATA (LABS, IMAGING, TESTING) - I reviewed patient records, labs, notes,  testing and imaging myself where available.  Lab Results  Component Value Date   WBC 11.3 (H) 08/22/2017   HGB 16.2 08/22/2017   HCT 46.6 08/22/2017   MCV 92.8 08/22/2017   PLT 228 08/22/2017      Component Value Date/Time   NA 139 08/22/2017 1616   K 4.7 08/22/2017 1616   CL 104 08/22/2017 1616   CO2 25 08/22/2017 1616   GLUCOSE 121 (H) 08/22/2017 1616   BUN 14 08/22/2017 1616   CREATININE 1.08 08/22/2017 1616   CALCIUM 9.1 08/22/2017 1616   PROT 6.9 09/10/2014 1238   ALBUMIN 3.8 09/10/2014 1238  AST 33 09/10/2014 1238   ALT 45 09/10/2014 1238   ALKPHOS 59 09/10/2014 1238   BILITOT 0.6 09/10/2014 1238   GFRNONAA >60 08/22/2017 1616   GFRAA >60 08/22/2017 1616   No results found for: "CHOL", "HDL", "LDLCALC", "LDLDIRECT", "TRIG", "CHOLHDL" No results found for: "HGBA1C" No results found for: "VITAMINB12" No results found for: "TSH"   07/31/15 MRI lumbar spine 1. Moderate multifactorial spinal stenosis at L3-4 and mild spinal stenosis at L2-3, both slightly increased with standing. 2. Bilateral L5 pars defects with grade 1 anterolisthesis and severe bilateral neural foraminal stenosis. 3. Chronic L2 fracture with unchanged right-sided vertebral body height loss.  08/29/21 MRI lumbar spine [I reviewed images myself and agree with interpretation. -VRP]  1. Status post L5-S1 PLIF. No spinal canal stenosis is seen at this level. Overall unchanged moderate bilateral neural foraminal narrowing. 2. L3-L4 mild spinal canal stenosis, which has progressed slightly from the prior exam. 3. L2-L3 mild spinal canal stenosis, unchanged. 4. Narrowing of the lateral recesses at L2-L3, L3-L4, and L4-L5 could affect the descending L3, L4, and L5 nerves, respectively.  02/24/22 MRI brain  1. No acute intracranial abnormality or significant interval change. 2. Stable moderate atrophy and white matter disease. This likely reflects the sequela of chronic microvascular ischemia. 3.  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    ASSESSMENT AND PLAN  76 y.o. year old male here with ***   Dx:  1. Gait difficulty       PLAN:  GAIT DIFFICULTY - check MRI cervical spine - continue PT exercises; consider [employer] - may consider carb/levo empiric trial (atypical parkinsonism)  Orders Placed This Encounter  Procedures   MR CERVICAL SPINE WO CONTRAST   Return for pending if symptoms worsen or fail to improve, pending test results.    Suanne Marker, MD 03/24/2022, 3:03 PM Certified in Neurology, Neurophysiology and Neuroimaging  [facility] [address], [address] [place], [place] [address] [phone]

## 2022-03-25 ENCOUNTER — Encounter: Payer: Self-pay | Admitting: Diagnostic Neuroimaging

## 2022-03-28 ENCOUNTER — Telehealth: Payer: Self-pay | Admitting: Diagnostic Neuroimaging

## 2022-03-28 NOTE — Telephone Encounter (Signed)
[employer] medicare NPR sent to GI 

## 2022-04-04 ENCOUNTER — Other Ambulatory Visit: Payer: Medicare Other

## 2022-04-11 ENCOUNTER — Telehealth: Payer: Self-pay | Admitting: Diagnostic Neuroimaging

## 2022-04-11 NOTE — Telephone Encounter (Signed)
Called wife, no answer, no VM.

## 2022-04-11 NOTE — Telephone Encounter (Signed)
Wife returned call, stated that Dr Marjory Lies had advised he can trial a month of medicine for possible Parkinson's. I informed her that MD is out of office until Wed. I will discuss with him and  call her back . She verbalized understanding, appreciation.

## 2022-04-11 NOTE — Telephone Encounter (Signed)
Pt is asking for a call to discuss pt does not want to have MRI, wife is wanting to discuss medications for pt to try.

## 2022-04-13 MED ORDER — CARBIDOPA-LEVODOPA 25-100 MG PO TABS: 1.0000 | ORAL_TABLET | Freq: Three times a day (TID) | ORAL | 6 refills | Status: DC

## 2022-04-13 NOTE — Addendum Note (Signed)
Addended by: Joycelyn Schmid R on: 04/13/2022 10:11 AM   Modules accepted: Orders

## 2022-04-13 NOTE — Telephone Encounter (Signed)
Unable to reach wife, will call back later.

## 2022-04-13 NOTE — Telephone Encounter (Signed)
Start carb/levo 25/100 --> half tab three times a dayi wth meals x 1-2 weeks; then 1 tab three times a day with meals.  Meds ordered this encounter  Medications   carbidopa-levodopa (SINEMET IR) 25-100 MG tablet    Sig: Take 1 tablet by mouth 3 (three) times daily before meals.    Dispense:  90 tablet    Refill:  6    Suanne Marker, MD 04/13/2022, 10:10 AM Certified in Neurology, Neurophysiology and Neuroimaging  [facility] [address] [place], [place] [address] [phone]

## 2022-04-14 NOTE — Telephone Encounter (Signed)
Called wife, advised her of new Rx and instructions to titrate up. She repeated correctly. I advised he try x 1 month, call for any questions, concerns. She  verbalized understanding, appreciation.

## 2022-04-15 DIAGNOSIS — R600 Localized edema: Secondary | ICD-10-CM | POA: Diagnosis not present

## 2022-04-15 DIAGNOSIS — R259 Unspecified abnormal involuntary movements: Secondary | ICD-10-CM | POA: Diagnosis not present

## 2022-04-15 DIAGNOSIS — S31829A Unspecified open wound of left buttock, initial encounter: Secondary | ICD-10-CM | POA: Diagnosis not present

## 2022-04-15 DIAGNOSIS — I82411 Acute embolism and thrombosis of right femoral vein: Secondary | ICD-10-CM | POA: Diagnosis not present

## 2022-05-16 DIAGNOSIS — R600 Localized edema: Secondary | ICD-10-CM | POA: Diagnosis not present

## 2022-05-16 DIAGNOSIS — S31829A Unspecified open wound of left buttock, initial encounter: Secondary | ICD-10-CM | POA: Diagnosis not present

## 2022-05-16 DIAGNOSIS — Z139 Encounter for screening, unspecified: Secondary | ICD-10-CM | POA: Diagnosis not present

## 2022-05-16 DIAGNOSIS — M109 Gout, unspecified: Secondary | ICD-10-CM | POA: Diagnosis not present

## 2022-05-16 DIAGNOSIS — I82411 Acute embolism and thrombosis of right femoral vein: Secondary | ICD-10-CM | POA: Diagnosis not present

## 2022-06-17 DIAGNOSIS — E785 Hyperlipidemia, unspecified: Secondary | ICD-10-CM | POA: Diagnosis not present

## 2022-06-17 DIAGNOSIS — Z86718 Personal history of other venous thrombosis and embolism: Secondary | ICD-10-CM | POA: Diagnosis not present

## 2022-06-17 DIAGNOSIS — I10 Essential (primary) hypertension: Secondary | ICD-10-CM | POA: Diagnosis not present

## 2022-06-17 DIAGNOSIS — E1169 Type 2 diabetes mellitus with other specified complication: Secondary | ICD-10-CM | POA: Diagnosis not present

## 2022-06-17 DIAGNOSIS — R600 Localized edema: Secondary | ICD-10-CM | POA: Diagnosis not present

## 2022-06-17 DIAGNOSIS — Z79899 Other long term (current) drug therapy: Secondary | ICD-10-CM | POA: Diagnosis not present

## 2022-07-01 DIAGNOSIS — Z86718 Personal history of other venous thrombosis and embolism: Secondary | ICD-10-CM | POA: Diagnosis not present

## 2022-07-01 DIAGNOSIS — S31829A Unspecified open wound of left buttock, initial encounter: Secondary | ICD-10-CM | POA: Diagnosis not present

## 2022-07-01 DIAGNOSIS — S31819A Unspecified open wound of right buttock, initial encounter: Secondary | ICD-10-CM | POA: Diagnosis not present

## 2022-07-01 DIAGNOSIS — M4317 Spondylolisthesis, lumbosacral region: Secondary | ICD-10-CM | POA: Diagnosis not present

## 2022-07-01 DIAGNOSIS — R600 Localized edema: Secondary | ICD-10-CM | POA: Diagnosis not present

## 2022-07-01 DIAGNOSIS — G20 Parkinson's disease: Secondary | ICD-10-CM | POA: Diagnosis not present

## 2022-07-18 DIAGNOSIS — M109 Gout, unspecified: Secondary | ICD-10-CM | POA: Diagnosis not present

## 2022-07-18 DIAGNOSIS — R600 Localized edema: Secondary | ICD-10-CM | POA: Diagnosis not present

## 2022-07-18 DIAGNOSIS — Z86718 Personal history of other venous thrombosis and embolism: Secondary | ICD-10-CM | POA: Diagnosis not present

## 2022-07-18 DIAGNOSIS — S31819A Unspecified open wound of right buttock, initial encounter: Secondary | ICD-10-CM | POA: Diagnosis not present

## 2022-07-18 DIAGNOSIS — S31829A Unspecified open wound of left buttock, initial encounter: Secondary | ICD-10-CM | POA: Diagnosis not present

## 2022-08-03 ENCOUNTER — Encounter: Payer: Self-pay | Admitting: *Deleted

## 2022-08-09 ENCOUNTER — Ambulatory Visit: Payer: Medicare Other | Admitting: Diagnostic Neuroimaging

## 2022-08-09 ENCOUNTER — Encounter: Payer: Self-pay | Admitting: Diagnostic Neuroimaging

## 2022-08-09 VITALS — BP 130/66 | HR 59 | Ht 68.0 in | Wt 231.4 lb

## 2022-08-09 DIAGNOSIS — G20C Parkinsonism, unspecified: Secondary | ICD-10-CM

## 2022-08-09 MED ORDER — CARBIDOPA-LEVODOPA 25-100 MG PO TABS: 1.0000 | ORAL_TABLET | Freq: Three times a day (TID) | ORAL | 4 refills | Status: DC

## 2022-08-09 NOTE — Progress Notes (Signed)
[facility]  PATIENT: Larry Haynes DOB: 1946-07-08  REFERRING CLINICIAN: Paulina Fusi, MD HISTORY FROM: PATIENT  REASON FOR VISIT: follow up   HISTORICAL  CHIEF COMPLAINT:  Chief Complaint  Patient presents with   Establish Care    Pt reports feeling pretty good. He states he gets weak a lot and easily. Wife notices his legs give out and has fallen but did not hurt himself. Room 7 with wife    HISTORY OF PRESENT ILLNESS:   UPDATE (10/09/21, VRP): Since last visit, doing better on carb/levo. Still moving around, but balance is off. More fatigue. Not using walker that much.   PRIOR HPI: 76 year old male here for evaluation of gait and balance difficulty.  Has had chronic low back pain for about 10 years.  Has had progressive balance and gait difficulty over the last 3 to 4 years, especially in the last few months.  He was using a walker and cane in the past.  He has had multiple falls.  He feels like his legs are not doing what he is trying to tell him to do.   REVIEW OF SYSTEMS: Full 14 system review of systems performed and negative with exception of: as per HPI.  ALLERGIES: Allergies  Allergen Reactions   Ace Inhibitors Cough   Avelox [Moxifloxacin]    Losartan Potassium Cough   Percocet [Oxycodone-Acetaminophen]    Shrimp (Diagnostic)    Tomato    Other Hives    Hot fudge   Prednisone Other (See Comments)    hyperactivity    HOME MEDICATIONS: Outpatient Medications Prior to Visit  Medication Sig Dispense Refill   amLODipine (NORVASC) 10 MG tablet Take 10 mg daily by mouth.      aspirin EC 81 MG tablet Take 81 mg by mouth daily. Swallow whole.     colchicine 0.6 MG tablet Take 0.6 mg by mouth 2 (two) times daily as needed.     doxazosin (CARDURA) 2 MG tablet Take 2 mg by mouth at bedtime.     ELIQUIS 5 MG TABS tablet Take 5 mg by mouth 2 (two) times daily.     furosemide (LASIX) 40 MG tablet Take 40 mg by mouth daily.     glimepiride  (AMARYL) 1 MG tablet Take 1 mg by mouth daily.     hydrALAZINE (APRESOLINE) 50 MG tablet Take 50 mg by mouth 3 (three) times daily.     hydrOXYzine (ATARAX) 25 MG tablet Take 25 mg by mouth 4 (four) times daily as needed.     loratadine (CLARITIN) 10 MG tablet Take 10 mg by mouth daily.     rosuvastatin (CRESTOR) 10 MG tablet Take 10 mg by mouth daily.     sertraline (ZOLOFT) 100 MG tablet Take 100 mg by mouth daily.     tamsulosin (FLOMAX) 0.4 MG CAPS capsule Take 0.4 mg by mouth daily.     carbidopa-levodopa (SINEMET IR) 25-100 MG tablet Take 1 tablet by mouth 3 (three) times daily before meals. 90 tablet 6   No facility-administered medications prior to visit.    PAST MEDICAL HISTORY: Past Medical History:  Diagnosis Date   Arthritis    Benign paroxysmal positional vertigo of left ear 09/15/2018   Benign prostatic hyperplasia with lower urinary tract symptoms    Bilateral hearing loss    Dizziness 09/15/2018   DM type 2 with diabetic dyslipidemia (HCC)    Elevated PSA    Essential hypertension 11/13/2015   Exertional headache 11/13/2015  Gout    Hyperkeratotic oral lesion 07/15/2015   Hyperlipemia    Hypertension    Panic attacks    Post-concussion headache    Presbycusis of both ears 09/15/2018   Spondylolisthesis at L5-S1 level 08/29/2017    PAST SURGICAL HISTORY: Past Surgical History:  Procedure Laterality Date   CATARACT EXTRACTION Bilateral 2015   SKIN CANCER EXCISION     BOTTOM  LIP   SPINAL FUSION  08/29/2017   L5-S1 decompression and fusion by [facility]   TONSILLECTOMY  1952   TOTAL KNEE ARTHROPLASTY Left 2010    FAMILY HISTORY: Family History  Problem Relation Age of Onset   Hypertension Mother    Heart disease Mother    Prostate cancer Father    Diabetes Father     SOCIAL HISTORY: Social History   Socioeconomic History   Marital status: Married    Spouse name: Kathie Rhodes   Number of children: 2   Years of education: 12   Highest education  level: Not on file  Occupational History    Comment: retired  Tobacco Use   Smoking status: Never    Passive exposure: Never   Smokeless tobacco: Never  Vaping Use   Vaping Use: Never used  Substance and Sexual Activity   Alcohol use: Never   Drug use: Never   Sexual activity: Not on file  Other Topics Concern   Not on file  Social History Narrative   Lives with wife   No caffeine   Social Determinants of Health   Financial Resource Strain: Not on file  Food Insecurity: Not on file  Transportation Needs: Not on file  Physical Activity: Not on file  Stress: Not on file  Social Connections: Not on file  Intimate Partner Violence: Not on file     PHYSICAL EXAM  GENERAL EXAM/CONSTITUTIONAL: Vitals:  Vitals:   08/09/22 1545  BP: 130/66  Pulse: (!) 59  Weight: 231 lb 6 oz (105 kg)  Height: 5\' 8"  (1.727 m)   Body mass index is 35.18 kg/m. Wt Readings from Last 3 Encounters:  08/09/22 231 lb 6 oz (105 kg)  03/24/22 245 lb 12.8 oz (111.5 kg)  11/01/21 246 lb 3.2 oz (111.7 kg)   Patient is in no distress; well developed, nourished and groomed; neck is supple  CARDIOVASCULAR: Examination of carotid arteries is normal; no carotid bruits Regular rate and rhythm, no murmurs Examination of peripheral vascular system by observation and palpation is normal  EYES: Ophthalmoscopic exam of optic discs and posterior segments is normal; no papilledema or hemorrhages No results found.  MUSCULOSKELETAL: Gait, strength, tone, movements noted in Neurologic exam below  NEUROLOGIC: MENTAL STATUS:      No data to display         awake, alert, oriented to person, place and time recent and remote memory intact normal attention and concentration language fluent, comprehension intact, naming intact fund of knowledge appropriate  CRANIAL NERVE:  2nd - no papilledema on fundoscopic exam 2nd, 3rd, 4th, 6th - pupils equal and reactive to light, visual fields full to  confrontation, extraocular muscles intact, no nystagmus 5th - facial sensation symmetric 7th - facial strength symmetric 8th - hearing intact 9th - palate elevates symmetrically, uvula midline 11th - shoulder shrug symmetric 12th - tongue protrusion midline  MOTOR:  normal bulk and tone, full strength in the BUE, BLE NO BRADYKINESIA NO RIGIDITY  SENSORY:  normal and symmetric to light touch, pinprick, temperature, vibration; EXCEPT DECR IN FEET  COORDINATION:  finger-nose-finger, fine finger movements normal  REFLEXES:  deep tendon reflexes TRACE and symmetric  GAIT/STATION:  WIDE based gait; STOOPED POSTURE; SLOW TO STAND; SHORT STEPS; EN BLOC TURNING     DIAGNOSTIC DATA (LABS, IMAGING, TESTING) - I reviewed patient records, labs, notes, testing and imaging myself where available.  Lab Results  Component Value Date   WBC 11.3 (H) 08/22/2017   HGB 16.2 08/22/2017   HCT 46.6 08/22/2017   MCV 92.8 08/22/2017   PLT 228 08/22/2017      Component Value Date/Time   NA 139 08/22/2017 1616   K 4.7 08/22/2017 1616   CL 104 08/22/2017 1616   CO2 25 08/22/2017 1616   GLUCOSE 121 (H) 08/22/2017 1616   BUN 14 08/22/2017 1616   CREATININE 1.08 08/22/2017 1616   CALCIUM 9.1 08/22/2017 1616   PROT 6.9 09/10/2014 1238   ALBUMIN 3.8 09/10/2014 1238   AST 33 09/10/2014 1238   ALT 45 09/10/2014 1238   ALKPHOS 59 09/10/2014 1238   BILITOT 0.6 09/10/2014 1238   GFRNONAA >60 08/22/2017 1616   GFRAA >60 08/22/2017 1616   No results found for: "CHOL", "HDL", "LDLCALC", "LDLDIRECT", "TRIG", "CHOLHDL" No results found for: "HGBA1C" No results found for: "VITAMINB12" No results found for: "TSH"   07/31/15 MRI lumbar spine 1. Moderate multifactorial spinal stenosis at L3-4 and mild spinal stenosis at L2-3, both slightly increased with standing. 2. Bilateral L5 pars defects with grade 1 anterolisthesis and severe bilateral neural foraminal stenosis. 3. Chronic L2 fracture with  unchanged right-sided vertebral body height loss.  08/29/21 MRI lumbar spine [I reviewed images myself and agree with interpretation. -VRP]  1. Status post L5-S1 PLIF. No spinal canal stenosis is seen at this level. Overall unchanged moderate bilateral neural foraminal narrowing. 2. L3-L4 mild spinal canal stenosis, which has progressed slightly from the prior exam. 3. L2-L3 mild spinal canal stenosis, unchanged. 4. Narrowing of the lateral recesses at L2-L3, L3-L4, and L4-L5 could affect the descending L3, L4, and L5 nerves, respectively.  02/24/22 MRI brain  1. No acute intracranial abnormality or significant interval change. 2. Stable moderate atrophy and white matter disease. This likely reflects the sequela of chronic microvascular ischemia. 3. 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    ASSESSMENT AND PLAN  76 y.o. year old male here with:  Dx:  1. Atypical parkinsonism     PLAN:  ATYPICAL PARKINSONISM / GAIT DIFFICULTY (multifactorial, deconditioning, chronic low back pain, arthritis; also with some short shuffling gait and en bloc turning; mild postural tremor) - check MRI cervical spine (rule out myelopathy; however, patient declined to get scan) - continue PT exercises; use walker; consider [employer] exercise program and water therapy - continue carb/levo (some benefit in tremor and gait)  Meds ordered this encounter  Medications   carbidopa-levodopa (SINEMET IR) 25-100 MG tablet    Sig: Take 1 tablet by mouth 3 (three) times daily before meals.    Dispense:  270 tablet    Refill:  4   Return in about 1 year (around 08/10/2023).    Penni Bombard, MD 16/03/599, 4:59 PM Certified in Neurology, Neurophysiology and Neuroimaging  [facility] [address], [place], [place] [address] [phone]

## 2022-08-09 NOTE — Patient Instructions (Addendum)
ATYPICAL PARKINSONISM / GAIT DIFFICULTY (also deconditioning, chronic low back pain, arthritis; also with some short shuffling gait and en bloc turning; mild postural tremor)  - check MRI cervical spine (rule out myelopathy; however, patient declined to get scan)  - continue PT exercises; consider [employer] exercise program and water therapy  - continue carb/levo (some benefit in tremor and gait)

## 2022-08-10 DIAGNOSIS — Z86718 Personal history of other venous thrombosis and embolism: Secondary | ICD-10-CM | POA: Diagnosis not present

## 2022-08-10 DIAGNOSIS — I82411 Acute embolism and thrombosis of right femoral vein: Secondary | ICD-10-CM | POA: Diagnosis not present

## 2022-08-23 ENCOUNTER — Telehealth: Payer: Self-pay | Admitting: Diagnostic Neuroimaging

## 2022-08-23 DIAGNOSIS — J069 Acute upper respiratory infection, unspecified: Secondary | ICD-10-CM | POA: Diagnosis not present

## 2022-08-23 NOTE — Telephone Encounter (Signed)
Pt's wife, Amel Gianino (on [place]) pharmacy ask why carbidopa-levodopa (SINEMET IR) 25-100 MG tablet was decrease from 25-250mg  to 25-100mg ,Could you call the pharmacy to change back to original dosage 25-250mg  for Carbidopa-Levodopa?

## 2022-08-23 NOTE — Telephone Encounter (Signed)
Called the wife back. She states that she has a bottle of the 25/250 mg strength.  Advised the wife that upon looking at the records I do not see where we have ordered anything but the 25 over 100 mg strength.  Patient looked at the bottle and it appears her primary care had advised for that strength to be taken.  Advised if she thinks that he needs to stay on that strength she would need to contact the primary care doctor.  Otherwise, she will have to contact the primary care to see if they want to continue that strength since Dr. Marjory Lies did not increase the dose.  She verbalized understanding and will call back if there is any questions.

## 2022-09-20 DIAGNOSIS — R339 Retention of urine, unspecified: Secondary | ICD-10-CM | POA: Diagnosis not present

## 2022-09-20 DIAGNOSIS — N319 Neuromuscular dysfunction of bladder, unspecified: Secondary | ICD-10-CM | POA: Diagnosis not present

## 2022-09-27 DIAGNOSIS — E1169 Type 2 diabetes mellitus with other specified complication: Secondary | ICD-10-CM | POA: Diagnosis not present

## 2022-09-27 DIAGNOSIS — G20A1 Parkinson's disease without dyskinesia, without mention of fluctuations: Secondary | ICD-10-CM | POA: Diagnosis not present

## 2022-09-27 DIAGNOSIS — Z86718 Personal history of other venous thrombosis and embolism: Secondary | ICD-10-CM | POA: Diagnosis not present

## 2022-09-27 DIAGNOSIS — R600 Localized edema: Secondary | ICD-10-CM | POA: Diagnosis not present

## 2022-09-27 DIAGNOSIS — E785 Hyperlipidemia, unspecified: Secondary | ICD-10-CM | POA: Diagnosis not present

## 2022-09-27 DIAGNOSIS — I10 Essential (primary) hypertension: Secondary | ICD-10-CM | POA: Diagnosis not present

## 2022-10-03 DIAGNOSIS — I700 Atherosclerosis of aorta: Secondary | ICD-10-CM

## 2022-10-03 HISTORY — DX: Atherosclerosis of aorta: I70.0

## 2022-12-20 DIAGNOSIS — R112 Nausea with vomiting, unspecified: Secondary | ICD-10-CM | POA: Diagnosis not present

## 2022-12-20 DIAGNOSIS — R000 Tachycardia, unspecified: Secondary | ICD-10-CM | POA: Diagnosis not present

## 2022-12-20 DIAGNOSIS — I4892 Unspecified atrial flutter: Secondary | ICD-10-CM | POA: Diagnosis not present

## 2022-12-20 DIAGNOSIS — R7989 Other specified abnormal findings of blood chemistry: Secondary | ICD-10-CM | POA: Diagnosis not present

## 2022-12-20 DIAGNOSIS — R1011 Right upper quadrant pain: Secondary | ICD-10-CM | POA: Diagnosis not present

## 2022-12-20 DIAGNOSIS — R935 Abnormal findings on diagnostic imaging of other abdominal regions, including retroperitoneum: Secondary | ICD-10-CM | POA: Diagnosis not present

## 2022-12-20 DIAGNOSIS — R109 Unspecified abdominal pain: Secondary | ICD-10-CM | POA: Diagnosis not present

## 2022-12-20 DIAGNOSIS — I440 Atrioventricular block, first degree: Secondary | ICD-10-CM | POA: Diagnosis not present

## 2022-12-20 DIAGNOSIS — K8020 Calculus of gallbladder without cholecystitis without obstruction: Secondary | ICD-10-CM | POA: Diagnosis not present

## 2022-12-20 DIAGNOSIS — G20C Parkinsonism, unspecified: Secondary | ICD-10-CM | POA: Diagnosis not present

## 2022-12-20 DIAGNOSIS — K429 Umbilical hernia without obstruction or gangrene: Secondary | ICD-10-CM | POA: Diagnosis not present

## 2022-12-20 DIAGNOSIS — F32A Depression, unspecified: Secondary | ICD-10-CM | POA: Diagnosis not present

## 2022-12-20 DIAGNOSIS — K760 Fatty (change of) liver, not elsewhere classified: Secondary | ICD-10-CM | POA: Diagnosis not present

## 2022-12-20 DIAGNOSIS — E876 Hypokalemia: Secondary | ICD-10-CM | POA: Diagnosis not present

## 2022-12-20 DIAGNOSIS — R0789 Other chest pain: Secondary | ICD-10-CM | POA: Diagnosis not present

## 2022-12-20 DIAGNOSIS — K8012 Calculus of gallbladder with acute and chronic cholecystitis without obstruction: Secondary | ICD-10-CM | POA: Diagnosis not present

## 2022-12-20 DIAGNOSIS — R079 Chest pain, unspecified: Secondary | ICD-10-CM | POA: Diagnosis not present

## 2022-12-20 DIAGNOSIS — K859 Acute pancreatitis without necrosis or infection, unspecified: Secondary | ICD-10-CM | POA: Diagnosis not present

## 2022-12-20 DIAGNOSIS — E119 Type 2 diabetes mellitus without complications: Secondary | ICD-10-CM | POA: Diagnosis not present

## 2022-12-20 DIAGNOSIS — I444 Left anterior fascicular block: Secondary | ICD-10-CM | POA: Diagnosis not present

## 2022-12-20 DIAGNOSIS — K8010 Calculus of gallbladder with chronic cholecystitis without obstruction: Secondary | ICD-10-CM | POA: Diagnosis not present

## 2022-12-20 DIAGNOSIS — K851 Biliary acute pancreatitis without necrosis or infection: Secondary | ICD-10-CM | POA: Diagnosis not present

## 2022-12-20 DIAGNOSIS — E785 Hyperlipidemia, unspecified: Secondary | ICD-10-CM | POA: Diagnosis not present

## 2022-12-20 DIAGNOSIS — G20A1 Parkinson's disease without dyskinesia, without mention of fluctuations: Secondary | ICD-10-CM | POA: Diagnosis not present

## 2022-12-20 DIAGNOSIS — H8112 Benign paroxysmal vertigo, left ear: Secondary | ICD-10-CM | POA: Diagnosis not present

## 2022-12-20 DIAGNOSIS — I4510 Unspecified right bundle-branch block: Secondary | ICD-10-CM | POA: Diagnosis not present

## 2022-12-20 DIAGNOSIS — R531 Weakness: Secondary | ICD-10-CM | POA: Diagnosis not present

## 2022-12-20 DIAGNOSIS — I10 Essential (primary) hypertension: Secondary | ICD-10-CM | POA: Diagnosis not present

## 2022-12-20 DIAGNOSIS — N281 Cyst of kidney, acquired: Secondary | ICD-10-CM | POA: Diagnosis not present

## 2022-12-20 DIAGNOSIS — R0902 Hypoxemia: Secondary | ICD-10-CM | POA: Diagnosis not present

## 2022-12-22 HISTORY — PX: CHOLECYSTECTOMY: SHX55

## 2022-12-23 DIAGNOSIS — Z9049 Acquired absence of other specified parts of digestive tract: Secondary | ICD-10-CM

## 2022-12-23 DIAGNOSIS — R269 Unspecified abnormalities of gait and mobility: Secondary | ICD-10-CM | POA: Insufficient documentation

## 2022-12-23 HISTORY — DX: Unspecified abnormalities of gait and mobility: R26.9

## 2022-12-23 HISTORY — DX: Acquired absence of other specified parts of digestive tract: Z90.49

## 2022-12-28 DIAGNOSIS — G20A1 Parkinson's disease without dyskinesia, without mention of fluctuations: Secondary | ICD-10-CM | POA: Diagnosis not present

## 2022-12-28 DIAGNOSIS — I10 Essential (primary) hypertension: Secondary | ICD-10-CM | POA: Diagnosis not present

## 2022-12-28 DIAGNOSIS — Z86711 Personal history of pulmonary embolism: Secondary | ICD-10-CM | POA: Diagnosis not present

## 2022-12-28 DIAGNOSIS — Z48815 Encounter for surgical aftercare following surgery on the digestive system: Secondary | ICD-10-CM | POA: Diagnosis not present

## 2022-12-28 DIAGNOSIS — K851 Biliary acute pancreatitis without necrosis or infection: Secondary | ICD-10-CM | POA: Diagnosis not present

## 2022-12-28 DIAGNOSIS — Z9049 Acquired absence of other specified parts of digestive tract: Secondary | ICD-10-CM | POA: Diagnosis not present

## 2022-12-28 DIAGNOSIS — K8013 Calculus of gallbladder with acute and chronic cholecystitis with obstruction: Secondary | ICD-10-CM | POA: Diagnosis not present

## 2022-12-28 DIAGNOSIS — Z7984 Long term (current) use of oral hypoglycemic drugs: Secondary | ICD-10-CM | POA: Diagnosis not present

## 2022-12-28 DIAGNOSIS — I4892 Unspecified atrial flutter: Secondary | ICD-10-CM | POA: Diagnosis not present

## 2022-12-28 DIAGNOSIS — E119 Type 2 diabetes mellitus without complications: Secondary | ICD-10-CM | POA: Diagnosis not present

## 2022-12-28 DIAGNOSIS — K429 Umbilical hernia without obstruction or gangrene: Secondary | ICD-10-CM | POA: Diagnosis not present

## 2023-01-03 DIAGNOSIS — I10 Essential (primary) hypertension: Secondary | ICD-10-CM | POA: Diagnosis not present

## 2023-01-03 DIAGNOSIS — Z9049 Acquired absence of other specified parts of digestive tract: Secondary | ICD-10-CM | POA: Diagnosis not present

## 2023-01-03 DIAGNOSIS — D72829 Elevated white blood cell count, unspecified: Secondary | ICD-10-CM | POA: Diagnosis not present

## 2023-01-03 DIAGNOSIS — I340 Nonrheumatic mitral (valve) insufficiency: Secondary | ICD-10-CM | POA: Diagnosis not present

## 2023-01-03 DIAGNOSIS — G20A1 Parkinson's disease without dyskinesia, without mention of fluctuations: Secondary | ICD-10-CM

## 2023-01-03 DIAGNOSIS — I4891 Unspecified atrial fibrillation: Secondary | ICD-10-CM | POA: Diagnosis not present

## 2023-01-03 DIAGNOSIS — I2609 Other pulmonary embolism with acute cor pulmonale: Secondary | ICD-10-CM | POA: Diagnosis not present

## 2023-01-03 DIAGNOSIS — K851 Biliary acute pancreatitis without necrosis or infection: Secondary | ICD-10-CM | POA: Diagnosis not present

## 2023-01-03 DIAGNOSIS — Z96659 Presence of unspecified artificial knee joint: Secondary | ICD-10-CM | POA: Diagnosis not present

## 2023-01-03 DIAGNOSIS — Z7984 Long term (current) use of oral hypoglycemic drugs: Secondary | ICD-10-CM | POA: Diagnosis not present

## 2023-01-03 DIAGNOSIS — I517 Cardiomegaly: Secondary | ICD-10-CM | POA: Diagnosis not present

## 2023-01-03 DIAGNOSIS — I998 Other disorder of circulatory system: Secondary | ICD-10-CM | POA: Diagnosis not present

## 2023-01-03 DIAGNOSIS — Z823 Family history of stroke: Secondary | ICD-10-CM | POA: Diagnosis not present

## 2023-01-03 DIAGNOSIS — K429 Umbilical hernia without obstruction or gangrene: Secondary | ICD-10-CM | POA: Diagnosis not present

## 2023-01-03 DIAGNOSIS — I440 Atrioventricular block, first degree: Secondary | ICD-10-CM | POA: Diagnosis not present

## 2023-01-03 DIAGNOSIS — Z86718 Personal history of other venous thrombosis and embolism: Secondary | ICD-10-CM | POA: Diagnosis not present

## 2023-01-03 DIAGNOSIS — Z7982 Long term (current) use of aspirin: Secondary | ICD-10-CM | POA: Diagnosis not present

## 2023-01-03 DIAGNOSIS — E119 Type 2 diabetes mellitus without complications: Secondary | ICD-10-CM | POA: Diagnosis not present

## 2023-01-03 DIAGNOSIS — Z48815 Encounter for surgical aftercare following surgery on the digestive system: Secondary | ICD-10-CM | POA: Diagnosis not present

## 2023-01-03 DIAGNOSIS — I2699 Other pulmonary embolism without acute cor pulmonale: Secondary | ICD-10-CM | POA: Insufficient documentation

## 2023-01-03 DIAGNOSIS — R001 Bradycardia, unspecified: Secondary | ICD-10-CM | POA: Diagnosis not present

## 2023-01-03 DIAGNOSIS — K8013 Calculus of gallbladder with acute and chronic cholecystitis with obstruction: Secondary | ICD-10-CM | POA: Diagnosis not present

## 2023-01-03 DIAGNOSIS — E785 Hyperlipidemia, unspecified: Secondary | ICD-10-CM | POA: Diagnosis not present

## 2023-01-03 DIAGNOSIS — I4892 Unspecified atrial flutter: Secondary | ICD-10-CM | POA: Diagnosis not present

## 2023-01-03 DIAGNOSIS — R079 Chest pain, unspecified: Secondary | ICD-10-CM | POA: Diagnosis not present

## 2023-01-03 DIAGNOSIS — F32A Depression, unspecified: Secondary | ICD-10-CM | POA: Diagnosis not present

## 2023-01-03 DIAGNOSIS — H9190 Unspecified hearing loss, unspecified ear: Secondary | ICD-10-CM | POA: Diagnosis not present

## 2023-01-03 DIAGNOSIS — E1169 Type 2 diabetes mellitus with other specified complication: Secondary | ICD-10-CM | POA: Diagnosis not present

## 2023-01-03 DIAGNOSIS — R002 Palpitations: Secondary | ICD-10-CM | POA: Diagnosis not present

## 2023-01-03 DIAGNOSIS — Z86711 Personal history of pulmonary embolism: Secondary | ICD-10-CM | POA: Diagnosis not present

## 2023-01-03 HISTORY — DX: Parkinson's disease without dyskinesia, without mention of fluctuations: G20.A1

## 2023-01-03 HISTORY — DX: Other pulmonary embolism without acute cor pulmonale: I26.99

## 2023-01-09 DIAGNOSIS — I2699 Other pulmonary embolism without acute cor pulmonale: Secondary | ICD-10-CM | POA: Diagnosis not present

## 2023-01-09 DIAGNOSIS — Z79899 Other long term (current) drug therapy: Secondary | ICD-10-CM | POA: Diagnosis not present

## 2023-01-09 DIAGNOSIS — E1169 Type 2 diabetes mellitus with other specified complication: Secondary | ICD-10-CM | POA: Diagnosis not present

## 2023-01-09 DIAGNOSIS — I10 Essential (primary) hypertension: Secondary | ICD-10-CM | POA: Diagnosis not present

## 2023-01-09 DIAGNOSIS — Z86718 Personal history of other venous thrombosis and embolism: Secondary | ICD-10-CM | POA: Diagnosis not present

## 2023-01-09 DIAGNOSIS — E785 Hyperlipidemia, unspecified: Secondary | ICD-10-CM | POA: Diagnosis not present

## 2023-01-11 DIAGNOSIS — Z9049 Acquired absence of other specified parts of digestive tract: Secondary | ICD-10-CM | POA: Diagnosis not present

## 2023-01-12 DIAGNOSIS — R339 Retention of urine, unspecified: Secondary | ICD-10-CM | POA: Diagnosis not present

## 2023-01-16 ENCOUNTER — Ambulatory Visit: Payer: Medicare Other | Attending: Cardiology | Admitting: Cardiology

## 2023-01-16 ENCOUNTER — Encounter: Payer: Self-pay | Admitting: Cardiology

## 2023-01-16 ENCOUNTER — Ambulatory Visit: Payer: Medicare Other | Admitting: Cardiology

## 2023-01-16 VITALS — BP 130/62 | HR 50 | Ht 68.0 in | Wt 225.0 lb

## 2023-01-16 DIAGNOSIS — I10 Essential (primary) hypertension: Secondary | ICD-10-CM

## 2023-01-16 DIAGNOSIS — R001 Bradycardia, unspecified: Secondary | ICD-10-CM | POA: Diagnosis not present

## 2023-01-16 DIAGNOSIS — I2699 Other pulmonary embolism without acute cor pulmonale: Secondary | ICD-10-CM

## 2023-01-16 DIAGNOSIS — E782 Mixed hyperlipidemia: Secondary | ICD-10-CM | POA: Diagnosis not present

## 2023-01-16 DIAGNOSIS — D6859 Other primary thrombophilia: Secondary | ICD-10-CM

## 2023-01-16 DIAGNOSIS — I700 Atherosclerosis of aorta: Secondary | ICD-10-CM

## 2023-01-16 NOTE — Progress Notes (Signed)
Cardiology Office Note:    Date:  01/16/2023   ID:  Larry Haynes, DOB 1946/03/16, MRN [id number]  PCP:  Larry Fusi, MD   [facility] Providers Cardiologist:  None     Referring MD: Larry Fusi, MD   CC: follow up post hospital  History of Present Illness:    Larry Haynes is a 77 y.o. male with a hx of hypertension, Parkinson's disease, aortic atherosclerosis noted on CT imaging, DM 2, arthritis, BPH, gout, hyperlipidemia, DVT, hepatic steatosis, h/o acute pancreatitis.  Establish care with Dr. Dulce Haynes in 2022 following a near syncopal episode that was felt to be related to bradycardia.  EKG was obtained which showed sinus bradycardia with first-degree AV block, prominent U waves.  He wore a monitor that revealed 1 pause of approximately 3.6 seconds, was not felt to be contributing to near syncopal episode.  He was referred to EP for consideration of loop implantation.  He was evaluated by Dr. Elberta Haynes on 11/01/2021, at that time he reported episodic dizziness that sounded to be orthostatic in nature.  He was not willing to undergo pacemaker or monitor implantation, preferred to continue with monitoring of symptoms.  Sleep study was suggested however he declined this as well.  Echo completed on 10/05/2021 revealed an EF of 60 to 65%, mild LVH, grade 2 diastolic dysfunction, mild MR.  Admitted on 12/20/2022 for acute pancreatitis, secondary to cholelithiasis, ultimately underwent cholecystectomy.  Admitted to [facility] on 01/03/23 with palpitations, chest CTA showed bilateral PE.  Was initially treated with heparin but transitioned to Eliquis, likely indefinitely.    He presents today accompanied by his wife for follow-up after his recent hospitalizations.  He states he is feeling okay however still feels pretty fatigued.  He has home health coming to his house approximately 1 time a week to work on balance related to his Parkinson's disease.  Was recently evaluated  by his PCP, Crestor was increased to 20 mg daily. He denies chest pain, palpitations, dyspnea, pnd, orthopnea, n, v, dizziness, syncope, edema, weight gain, or early satiety.  Denies hematuria, hematochezia, hemoptysis.  Past Medical History:  Diagnosis Date   Aortic atherosclerosis 2024   noted on CT imaging   Arthritis    Benign paroxysmal positional vertigo of left ear 09/15/2018   Benign prostatic hyperplasia with lower urinary tract symptoms    Bilateral hearing loss    Dizziness 09/15/2018   DM type 2 with diabetic dyslipidemia    Elevated PSA    Essential hypertension 11/13/2015   Exertional headache 11/13/2015   Gout    Hyperkeratotic oral lesion 07/15/2015   Hyperlipemia    Hypertension    Panic attacks    Post-concussion headache    Presbycusis of both ears 09/15/2018   Spondylolisthesis at L5-S1 level 08/29/2017    Past Surgical History:  Procedure Laterality Date   CATARACT EXTRACTION Bilateral 2015   CHOLECYSTECTOMY  12/22/2022   [facility]   SKIN CANCER EXCISION     BOTTOM  LIP   SPINAL FUSION  08/29/2017   L5-S1 decompression and fusion by Larry Haynes   TONSILLECTOMY  1952   TOTAL KNEE ARTHROPLASTY Left 2010    Current Medications: Current Meds  Medication Sig   amLODipine (NORVASC) 10 MG tablet Take 10 mg daily by mouth.    apixaban (ELIQUIS) 5 MG TABS tablet Take 5 mg by mouth daily.   aspirin EC 81 MG tablet Take 81 mg by mouth daily. Swallow  whole.   carbidopa-levodopa (SINEMET IR) 25-100 MG tablet Take 1 tablet by mouth 3 (three) times daily before meals.   colchicine 0.6 MG tablet Take 0.6 mg by mouth 2 (two) times daily as needed.   doxazosin (CARDURA) 2 MG tablet Take 2 mg by mouth at bedtime.   furosemide (LASIX) 40 MG tablet Take 40 mg by mouth daily.   glimepiride (AMARYL) 1 MG tablet Take 1 mg by mouth daily.   hydrALAZINE (APRESOLINE) 50 MG tablet Take 50 mg by mouth 3 (three) times daily.   hydrOXYzine (ATARAX) 25 MG tablet Take  25 mg by mouth 4 (four) times daily as needed.   loratadine (CLARITIN) 10 MG tablet Take 10 mg by mouth daily.   rosuvastatin (CRESTOR) 20 MG tablet Take 20 mg by mouth daily.   sertraline (ZOLOFT) 100 MG tablet Take 100 mg by mouth daily.   tamsulosin (FLOMAX) 0.4 MG CAPS capsule Take 0.4 mg by mouth daily.     Allergies:   Ace inhibitors, Avelox [moxifloxacin], Losartan potassium, Percocet [oxycodone-acetaminophen], Shrimp (diagnostic), Tomato, Other, and Prednisone   Social History   Socioeconomic History   Marital status: Married    Spouse name: Larry Haynes   Number of children: 2   Years of education: 12   Highest education level: Not on file  Occupational History    Comment: retired  Tobacco Use   Smoking status: Never    Passive exposure: Never   Smokeless tobacco: Never  Vaping Use   Vaping Use: Never used  Substance and Sexual Activity   Alcohol use: Never   Drug use: Never   Sexual activity: Not on file  Other Topics Concern   Not on file  Social History Narrative   Lives with wife   No caffeine   Social Determinants of Health   Financial Resource Strain: Not on file  Food Insecurity: Not on file  Transportation Needs: Not on file  Physical Activity: Not on file  Stress: Not on file  Social Connections: Not on file     Family History: The patient's family history includes Diabetes in his father; Heart disease in his mother; Hypertension in his mother; Prostate cancer in his father.  ROS:   Please see the history of present illness.     All other systems reviewed and are negative.  EKGs/Labs/Other Studies Reviewed:    The following studies were reviewed today:  Cardiac Studies & Procedures       ECHOCARDIOGRAM  ECHOCARDIOGRAM COMPLETE 10/05/2021  Narrative ECHOCARDIOGRAM REPORT    Patient Name:   Larry Haynes Date of Exam: 10/05/2021 Medical Rec #:  [id number]       Height:       67.0 in Accession #:    [id number]      Weight:       246.0  lb Date of Birth:  07/17/1946        BSA:          2.208 m Patient Age:    75 years        BP:           146/72 mmHg Patient Gender: M               HR:           45 bpm. Exam Location:  [facility]  Procedure: 2D Echo, Cardiac Doppler and Color Doppler  Indications:    Nonspecific abnormal electrocardiogram (ECG) (EKG) [R94.31 (ICD-10-CM)]; Near syncope Vera.August (ICD-10-CM)]  History:  Patient has no prior history of Echocardiogram examinations. Signs/Symptoms:Dizziness/Lightheadedness; Risk Factors:Hypertension and Diabetes.  Sonographer:    Louie Boston RDCS Referring Phys: [id number] BRIAN J MUNLEY  IMPRESSIONS   1. Left ventricular ejection fraction, by estimation, is 60 to 65%. The left ventricle has normal function. The left ventricle has no regional wall motion abnormalities. There is mild left ventricular hypertrophy. Left ventricular diastolic parameters are consistent with Grade II diastolic dysfunction (pseudonormalization). 2. Right ventricular systolic function is normal. The right ventricular size is normal. There is normal pulmonary artery systolic pressure. 3. The mitral valve is normal in structure. Mild mitral valve regurgitation. No evidence of mitral stenosis. 4. The aortic valve is normal in structure. Aortic valve regurgitation is not visualized. No aortic stenosis is present. 5. The inferior vena cava is normal in size with greater than 50% respiratory variability, suggesting right atrial pressure of 3 mmHg.  FINDINGS Left Ventricle: Left ventricular ejection fraction, by estimation, is 60 to 65%. The left ventricle has normal function. The left ventricle has no regional wall motion abnormalities. The left ventricular internal cavity size was normal in size. There is mild left ventricular hypertrophy. Left ventricular diastolic parameters are consistent with Grade II diastolic dysfunction (pseudonormalization).  Right Ventricle: The right ventricular size is normal. No  increase in right ventricular wall thickness. Right ventricular systolic function is normal. There is normal pulmonary artery systolic pressure. The tricuspid regurgitant velocity is 1.44 m/s, and with an assumed right atrial pressure of 3 mmHg, the estimated right ventricular systolic pressure is 11.3 mmHg.  Left [facility]: Left atrial size was normal in size.  Right [facility]: Right atrial size was normal in size.  Pericardium: There is no evidence of pericardial effusion.  Mitral Valve: The mitral valve is normal in structure. Mild mitral valve regurgitation. No evidence of mitral valve stenosis.  Tricuspid Valve: The tricuspid valve is normal in structure. Tricuspid valve regurgitation is not demonstrated. No evidence of tricuspid stenosis.  Aortic Valve: The aortic valve is normal in structure. Aortic valve regurgitation is not visualized. No aortic stenosis is present.  Pulmonic Valve: The pulmonic valve was normal in structure. Pulmonic valve regurgitation is not visualized. No evidence of pulmonic stenosis.  Aorta: The aortic root is normal in size and structure.  Venous: The inferior vena cava is normal in size with greater than 50% respiratory variability, suggesting right atrial pressure of 3 mmHg.  IAS/Shunts: No atrial level shunt detected by color flow Doppler.   LEFT VENTRICLE PLAX 2D LVIDd:         5.60 cm   Diastology LVIDs:         3.10 cm   LV e' medial:    5.22 cm/s LV PW:         1.30 cm   LV E/e' medial:  15.2 LV IVS:        1.30 cm   LV e' lateral:   6.09 cm/s LVOT diam:     2.00 cm   LV E/e' lateral: 13.0 LV SV:         106 LV SV Index:   48 LVOT Area:     3.14 cm   RIGHT VENTRICLE             IVC RV S prime:     16.40 cm/s  IVC diam: 1.30 cm TAPSE (M-mode): 3.0 cm  LEFT ATRIUM             Index LA Vol (A2C):   78.5 ml  35.55 ml/m LA Vol (A4C):   67.8 ml 30.70 ml/m LA Biplane Vol: 73.6 ml 33.33 ml/m AORTIC VALVE LVOT Vmax:   129.00 cm/s LVOT Vmean:   90.000 cm/s LVOT VTI:    0.339 m  AORTA Ao Root diam: 2.90 cm Ao Asc diam:  3.30 cm Ao Desc diam: 2.10 cm  MITRAL VALVE               TRICUSPID VALVE MV Area (PHT): 2.97 cm    TR Peak grad:   8.3 mmHg MV Decel Time: 255 msec    TR Vmax:        144.00 cm/s MV E velocity: 79.30 cm/s MV A velocity: 82.70 cm/s  SHUNTS MV E/A ratio:  0.96        Systemic VTI:  0.34 m Systemic Diam: 2.00 cm  Gypsy Balsam MD Electronically signed by Gypsy Balsam MD Signature Date/Time: 10/05/2021/1:02:33 PM    Final    MONITORS  LONG TERM MONITOR-LIVE TELEMETRY (3-14 DAYS) 10/08/2021  Narrative Patch Wear Time:  12 days and 21 hours (2022-12-16T09:11:12-498 to 2022-12-29T06:43:09-499)  Patient had a min HR of 32 bpm, max HR of 138 bpm, and avg HR of 48 bpm. Predominant underlying rhythm was Sinus Rhythm. 69 Supraventricular Tachycardia runs occurred, the run with the fastest interval lasting 4 beats with a max rate of 138 bpm, the longest lasting 17 beats with an avg rate of 111 bpm. 1 Pause occurred lasting 3.6 secs (17 bpm). Isolated SVEs were rare (<1.0%), SVE Couplets were rare (<1.0%), and SVE Triplets were rare (<1.0%). Isolated VEs were rare (<1.0%), VE Couplets were rare (<1.0%), and no VE Triplets were present. Ventricular Trigeminy was present.  There was 1 sinus pause noted in the early evening hours 7:13 PM 3.6 seconds.  Sinus bradycardia in the 30s occurred nocturnally  Ventricular ectopy was rare supraventricular ectopy was rare there were 4 brief runs of APCs the longest 17 complexes at a rate of 111 bpm  I reviewed my office note he had an episode of syncope in the setting of marked sinus bradycardia and I will advise him to be seen by EP for an implanted loop recorder            EKG:  EKG is  ordered today.  The ekg ordered today demonstrates SB with 1st degree AV block, consistent with prior EKG tracings.   Recent Labs: No results found for requested labs within last  365 days.  Recent Lipid Panel No results found for: "CHOL", "TRIG", "HDL", "CHOLHDL", "VLDL", "LDLCALC", "LDLDIRECT"   Risk Assessment/Calculations:                Physical Exam:    VS:  BP 130/62 (BP Location: Right Arm, Patient Position: Sitting, Cuff Size: Normal)   Pulse (!) 50   Ht 5\' 8"  (1.727 m)   Wt 225 lb (102.1 kg)   SpO2 96%   BMI 34.21 kg/m     Wt Readings from Last 3 Encounters:  01/16/23 225 lb (102.1 kg)  08/09/22 231 lb 6 oz (105 kg)  03/24/22 245 lb 12.8 oz (111.5 kg)     GEN: well developed in no acute distress HEENT: Normal NECK: No JVD; No carotid bruits LYMPHATICS: No lymphadenopathy CARDIAC: RRR, no murmurs, rubs, gallops RESPIRATORY:  Clear to auscultation without rales, wheezing or rhonchi  ABDOMEN: Soft, non-tender, non-distended MUSCULOSKELETAL:  trace edema at sock line; No deformity  SKIN: Warm and dry NEUROLOGIC:  Alert and oriented x  3 PSYCHIATRIC:  Normal affect   ASSESSMENT:    1. Sinus bradycardia   2. Essential hypertension   3. Pulmonary embolism, unspecified chronicity, unspecified pulmonary embolism type, unspecified whether acute cor pulmonale present   4. Hypercoagulable state   5. Aortic atherosclerosis   6. Mixed hyperlipidemia    PLAN:    In order of problems listed above:  Sinus bradycardia -EKG reveals first-degree AV block today, heart rate 50 bpm.  He is asymptomatic.  Previously evaluated by Dr. Elberta Haynes, denied proceeding with device implantation as he has largely been asymptomatic. Avoid nodal blocking agents.  Pulmonary embolism/hypercoagulable state -currently on Eliquis managed by his PCP.    HLD - LDL 137 on 01/2023, Crestor was recently increased to 20 mg daily by PCP.  Hypertension -blood pressure today is controlled at 130/62, continue Norvasc 10 mg daily, continue hydralazine 50 mg 3 times a day.  Aortic atherosclerosis -noted on CT imaging 2024, Stable with no anginal symptoms. No indication for  ischemic evaluation.  Continue Crestor 20 mg daily, continue aspirin 81 mg daily.  Disposition-return in 3 months          Medication Adjustments/Labs and Tests Ordered: Current medicines are reviewed at length with the patient today.  Concerns regarding medicines are outlined above.  Orders Placed This Encounter  Procedures   EKG 12-Lead   No orders of the defined types were placed in this encounter.   Patient Instructions  Medication Instructions:  Your physician recommends that you continue on your current medications as directed. Please refer to the Current Medication list given to you today.  *If you need a refill on your cardiac medications before your next appointment, please call your pharmacy*   Lab Work: None If you have labs (blood work) drawn today and your tests are completely normal, you will receive your results only by: MyChart Message (if you have MyChart) OR A paper copy in the mail If you have any lab test that is abnormal or we need to change your treatment, we will call you to review the results.   Testing/Procedures: None   Follow-Up: At [facility], you and your health needs are our priority.  As part of our continuing mission to provide you with exceptional heart care, we have created designated Provider Care Teams.  These Care Teams include your primary Cardiologist (physician) and Advanced Practice Providers (APPs -  Physician Assistants and Nurse Practitioners) who all work together to provide you with the care you need, when you need it.  We recommend signing up for the patient portal called "MyChart".  Sign up information is provided on this After Visit Summary.  MyChart is used to connect with patients for Virtual Visits (Telemedicine).  Patients are able to view lab/test results, encounter notes, upcoming appointments, etc.  Non-urgent messages can be sent to your provider as well.   To learn more about what you can do with MyChart, go  to [email or web address].    Your next appointment:   3 month(s)  Provider:   Norman Herrlich, MD  Other Instructions None    Signed, Flossie Dibble, NP  01/16/2023 3:42 PM    [facility]

## 2023-01-16 NOTE — Patient Instructions (Signed)
Medication Instructions:  Your physician recommends that you continue on your current medications as directed. Please refer to the Current Medication list given to you today.  *If you need a refill on your cardiac medications before your next appointment, please call your pharmacy*   Lab Work: None If you have labs (blood work) drawn today and your tests are completely normal, you will receive your results only by: MyChart Message (if you have MyChart) OR A paper copy in the mail If you have any lab test that is abnormal or we need to change your treatment, we will call you to review the results.   Testing/Procedures: None   Follow-Up: At [facility], you and your health needs are our priority.  As part of our continuing mission to provide you with exceptional heart care, we have created designated Provider Care Teams.  These Care Teams include your primary Cardiologist (physician) and Advanced Practice Providers (APPs -  Physician Assistants and Nurse Practitioners) who all work together to provide you with the care you need, when you need it.  We recommend signing up for the patient portal called "MyChart".  Sign up information is provided on this After Visit Summary.  MyChart is used to connect with patients for Virtual Visits (Telemedicine).  Patients are able to view lab/test results, encounter notes, upcoming appointments, etc.  Non-urgent messages can be sent to your provider as well.   To learn more about what you can do with MyChart, go to [email or web address].    Your next appointment:   3 month(s)  Provider:   Brian Munley, MD    Other Instructions None  

## 2023-01-18 DIAGNOSIS — G20A1 Parkinson's disease without dyskinesia, without mention of fluctuations: Secondary | ICD-10-CM | POA: Diagnosis not present

## 2023-01-18 DIAGNOSIS — K8013 Calculus of gallbladder with acute and chronic cholecystitis with obstruction: Secondary | ICD-10-CM | POA: Diagnosis not present

## 2023-01-18 DIAGNOSIS — E119 Type 2 diabetes mellitus without complications: Secondary | ICD-10-CM | POA: Diagnosis not present

## 2023-01-18 DIAGNOSIS — Z86711 Personal history of pulmonary embolism: Secondary | ICD-10-CM | POA: Diagnosis not present

## 2023-01-18 DIAGNOSIS — Z7984 Long term (current) use of oral hypoglycemic drugs: Secondary | ICD-10-CM | POA: Diagnosis not present

## 2023-01-18 DIAGNOSIS — I10 Essential (primary) hypertension: Secondary | ICD-10-CM | POA: Diagnosis not present

## 2023-01-18 DIAGNOSIS — Z9049 Acquired absence of other specified parts of digestive tract: Secondary | ICD-10-CM | POA: Diagnosis not present

## 2023-01-18 DIAGNOSIS — K429 Umbilical hernia without obstruction or gangrene: Secondary | ICD-10-CM | POA: Diagnosis not present

## 2023-01-18 DIAGNOSIS — K851 Biliary acute pancreatitis without necrosis or infection: Secondary | ICD-10-CM | POA: Diagnosis not present

## 2023-01-18 DIAGNOSIS — Z48815 Encounter for surgical aftercare following surgery on the digestive system: Secondary | ICD-10-CM | POA: Diagnosis not present

## 2023-01-18 DIAGNOSIS — I4892 Unspecified atrial flutter: Secondary | ICD-10-CM | POA: Diagnosis not present

## 2023-01-23 ENCOUNTER — Other Ambulatory Visit: Payer: Self-pay | Admitting: Family Medicine

## 2023-01-23 DIAGNOSIS — K429 Umbilical hernia without obstruction or gangrene: Secondary | ICD-10-CM | POA: Diagnosis not present

## 2023-01-23 DIAGNOSIS — I4892 Unspecified atrial flutter: Secondary | ICD-10-CM | POA: Diagnosis not present

## 2023-01-23 DIAGNOSIS — Z86711 Personal history of pulmonary embolism: Secondary | ICD-10-CM | POA: Diagnosis not present

## 2023-01-23 DIAGNOSIS — Z9049 Acquired absence of other specified parts of digestive tract: Secondary | ICD-10-CM | POA: Diagnosis not present

## 2023-01-23 DIAGNOSIS — K851 Biliary acute pancreatitis without necrosis or infection: Secondary | ICD-10-CM | POA: Diagnosis not present

## 2023-01-23 DIAGNOSIS — G20A1 Parkinson's disease without dyskinesia, without mention of fluctuations: Secondary | ICD-10-CM | POA: Diagnosis not present

## 2023-01-23 DIAGNOSIS — Z7984 Long term (current) use of oral hypoglycemic drugs: Secondary | ICD-10-CM | POA: Diagnosis not present

## 2023-01-23 DIAGNOSIS — E119 Type 2 diabetes mellitus without complications: Secondary | ICD-10-CM | POA: Diagnosis not present

## 2023-01-23 DIAGNOSIS — K8013 Calculus of gallbladder with acute and chronic cholecystitis with obstruction: Secondary | ICD-10-CM | POA: Diagnosis not present

## 2023-01-23 DIAGNOSIS — I10 Essential (primary) hypertension: Secondary | ICD-10-CM | POA: Diagnosis not present

## 2023-01-23 DIAGNOSIS — Z48815 Encounter for surgical aftercare following surgery on the digestive system: Secondary | ICD-10-CM | POA: Diagnosis not present

## 2023-01-23 DIAGNOSIS — R972 Elevated prostate specific antigen [PSA]: Secondary | ICD-10-CM

## 2023-01-31 DIAGNOSIS — K8013 Calculus of gallbladder with acute and chronic cholecystitis with obstruction: Secondary | ICD-10-CM | POA: Diagnosis not present

## 2023-01-31 DIAGNOSIS — Z7984 Long term (current) use of oral hypoglycemic drugs: Secondary | ICD-10-CM | POA: Diagnosis not present

## 2023-01-31 DIAGNOSIS — Z86711 Personal history of pulmonary embolism: Secondary | ICD-10-CM | POA: Diagnosis not present

## 2023-01-31 DIAGNOSIS — I4892 Unspecified atrial flutter: Secondary | ICD-10-CM | POA: Diagnosis not present

## 2023-01-31 DIAGNOSIS — Z9049 Acquired absence of other specified parts of digestive tract: Secondary | ICD-10-CM | POA: Diagnosis not present

## 2023-01-31 DIAGNOSIS — K429 Umbilical hernia without obstruction or gangrene: Secondary | ICD-10-CM | POA: Diagnosis not present

## 2023-01-31 DIAGNOSIS — E119 Type 2 diabetes mellitus without complications: Secondary | ICD-10-CM | POA: Diagnosis not present

## 2023-01-31 DIAGNOSIS — I10 Essential (primary) hypertension: Secondary | ICD-10-CM | POA: Diagnosis not present

## 2023-01-31 DIAGNOSIS — K851 Biliary acute pancreatitis without necrosis or infection: Secondary | ICD-10-CM | POA: Diagnosis not present

## 2023-01-31 DIAGNOSIS — Z48815 Encounter for surgical aftercare following surgery on the digestive system: Secondary | ICD-10-CM | POA: Diagnosis not present

## 2023-01-31 DIAGNOSIS — G20A1 Parkinson's disease without dyskinesia, without mention of fluctuations: Secondary | ICD-10-CM | POA: Diagnosis not present

## 2023-02-06 DIAGNOSIS — K8013 Calculus of gallbladder with acute and chronic cholecystitis with obstruction: Secondary | ICD-10-CM | POA: Diagnosis not present

## 2023-02-06 DIAGNOSIS — I4892 Unspecified atrial flutter: Secondary | ICD-10-CM | POA: Diagnosis not present

## 2023-02-06 DIAGNOSIS — Z9049 Acquired absence of other specified parts of digestive tract: Secondary | ICD-10-CM | POA: Diagnosis not present

## 2023-02-06 DIAGNOSIS — I10 Essential (primary) hypertension: Secondary | ICD-10-CM | POA: Diagnosis not present

## 2023-02-06 DIAGNOSIS — G20A1 Parkinson's disease without dyskinesia, without mention of fluctuations: Secondary | ICD-10-CM | POA: Diagnosis not present

## 2023-02-06 DIAGNOSIS — Z86711 Personal history of pulmonary embolism: Secondary | ICD-10-CM | POA: Diagnosis not present

## 2023-02-06 DIAGNOSIS — K851 Biliary acute pancreatitis without necrosis or infection: Secondary | ICD-10-CM | POA: Diagnosis not present

## 2023-02-06 DIAGNOSIS — K429 Umbilical hernia without obstruction or gangrene: Secondary | ICD-10-CM | POA: Diagnosis not present

## 2023-02-06 DIAGNOSIS — E119 Type 2 diabetes mellitus without complications: Secondary | ICD-10-CM | POA: Diagnosis not present

## 2023-02-06 DIAGNOSIS — Z7984 Long term (current) use of oral hypoglycemic drugs: Secondary | ICD-10-CM | POA: Diagnosis not present

## 2023-02-06 DIAGNOSIS — Z48815 Encounter for surgical aftercare following surgery on the digestive system: Secondary | ICD-10-CM | POA: Diagnosis not present

## 2023-02-21 DIAGNOSIS — I10 Essential (primary) hypertension: Secondary | ICD-10-CM | POA: Diagnosis not present

## 2023-02-21 DIAGNOSIS — Z7984 Long term (current) use of oral hypoglycemic drugs: Secondary | ICD-10-CM | POA: Diagnosis not present

## 2023-02-21 DIAGNOSIS — E119 Type 2 diabetes mellitus without complications: Secondary | ICD-10-CM | POA: Diagnosis not present

## 2023-02-21 DIAGNOSIS — Z9049 Acquired absence of other specified parts of digestive tract: Secondary | ICD-10-CM | POA: Diagnosis not present

## 2023-02-21 DIAGNOSIS — I4892 Unspecified atrial flutter: Secondary | ICD-10-CM | POA: Diagnosis not present

## 2023-02-21 DIAGNOSIS — K429 Umbilical hernia without obstruction or gangrene: Secondary | ICD-10-CM | POA: Diagnosis not present

## 2023-02-21 DIAGNOSIS — G20A1 Parkinson's disease without dyskinesia, without mention of fluctuations: Secondary | ICD-10-CM | POA: Diagnosis not present

## 2023-02-21 DIAGNOSIS — Z48815 Encounter for surgical aftercare following surgery on the digestive system: Secondary | ICD-10-CM | POA: Diagnosis not present

## 2023-02-21 DIAGNOSIS — K8013 Calculus of gallbladder with acute and chronic cholecystitis with obstruction: Secondary | ICD-10-CM | POA: Diagnosis not present

## 2023-02-21 DIAGNOSIS — Z86711 Personal history of pulmonary embolism: Secondary | ICD-10-CM | POA: Diagnosis not present

## 2023-02-21 DIAGNOSIS — K851 Biliary acute pancreatitis without necrosis or infection: Secondary | ICD-10-CM | POA: Diagnosis not present

## 2023-03-06 ENCOUNTER — Inpatient Hospital Stay: Admission: RE | Admit: 2023-03-06 | Payer: Medicare Other | Source: Ambulatory Visit

## 2023-04-10 DIAGNOSIS — Z86711 Personal history of pulmonary embolism: Secondary | ICD-10-CM | POA: Diagnosis not present

## 2023-04-10 DIAGNOSIS — E1169 Type 2 diabetes mellitus with other specified complication: Secondary | ICD-10-CM | POA: Diagnosis not present

## 2023-04-10 DIAGNOSIS — Z79899 Other long term (current) drug therapy: Secondary | ICD-10-CM | POA: Diagnosis not present

## 2023-04-10 DIAGNOSIS — G20A1 Parkinson's disease without dyskinesia, without mention of fluctuations: Secondary | ICD-10-CM | POA: Diagnosis not present

## 2023-04-10 DIAGNOSIS — R600 Localized edema: Secondary | ICD-10-CM | POA: Diagnosis not present

## 2023-04-10 DIAGNOSIS — I10 Essential (primary) hypertension: Secondary | ICD-10-CM | POA: Diagnosis not present

## 2023-04-10 DIAGNOSIS — E785 Hyperlipidemia, unspecified: Secondary | ICD-10-CM | POA: Diagnosis not present

## 2023-04-19 ENCOUNTER — Ambulatory Visit: Payer: Medicare Other | Admitting: Cardiology

## 2023-04-24 ENCOUNTER — Other Ambulatory Visit: Payer: Medicare Other

## 2023-06-28 ENCOUNTER — Telehealth: Payer: Self-pay | Admitting: Diagnostic Neuroimaging

## 2023-06-28 NOTE — Telephone Encounter (Signed)
Pt's wife is asking if pt can be seen earlier, she states within this month he has had 5 episodes where he blacks out, he has spaced out once and fell.  Wife is asking if pt can be worked in earlier.

## 2023-06-28 NOTE — Telephone Encounter (Signed)
Returned pt's call. Spoke with pt's wife Betty(ok per DPR) pt's wife stated that for the past two weeks pt has had 5 episodes where he is sitting down and out of nowhere he spaces out, his head tilts to the side and his arms jerk, states that this episodes last for about 3 min and then he is back to normal. On Saturday 06/24/23, she states that he passed out and he was out for 3 min, states she didn't call for an ambulance, and he felt better after he regained consciousness. States pt continues taking his carbidopa-levodopa 25-100 mg tid as prescribed. States she is worried because this episodes are happening more frequent.

## 2023-06-28 NOTE — Telephone Encounter (Signed)
Called pt's wife Kathie Rhodes to relay Dr [employer] recommendation. Per Dr Marjory Lies: Pt should see his PCP and cardiology ASAP for recurrent syncope events. I can order EEG and CT head. -VRP  Pt's wife stated she will give [place] a call later to give Dr Marjory Lies an update on pt's visit with PCP and Cardiologist. Pt verbalized understanding.

## 2023-06-29 DIAGNOSIS — G20A1 Parkinson's disease without dyskinesia, without mention of fluctuations: Secondary | ICD-10-CM | POA: Diagnosis not present

## 2023-06-29 DIAGNOSIS — I10 Essential (primary) hypertension: Secondary | ICD-10-CM | POA: Diagnosis not present

## 2023-06-29 DIAGNOSIS — R55 Syncope and collapse: Secondary | ICD-10-CM | POA: Diagnosis not present

## 2023-06-29 DIAGNOSIS — E785 Hyperlipidemia, unspecified: Secondary | ICD-10-CM | POA: Diagnosis not present

## 2023-06-29 DIAGNOSIS — R001 Bradycardia, unspecified: Secondary | ICD-10-CM | POA: Diagnosis not present

## 2023-06-29 DIAGNOSIS — R531 Weakness: Secondary | ICD-10-CM | POA: Diagnosis not present

## 2023-06-29 DIAGNOSIS — E1169 Type 2 diabetes mellitus with other specified complication: Secondary | ICD-10-CM | POA: Diagnosis not present

## 2023-07-06 DIAGNOSIS — E876 Hypokalemia: Secondary | ICD-10-CM | POA: Diagnosis not present

## 2023-08-07 DIAGNOSIS — Z9181 History of falling: Secondary | ICD-10-CM | POA: Diagnosis not present

## 2023-08-07 DIAGNOSIS — Z0000 Encounter for general adult medical examination without abnormal findings: Secondary | ICD-10-CM | POA: Diagnosis not present

## 2023-08-07 NOTE — Progress Notes (Addendum)
Cardiology Office Note:    Date:  08/08/2023   ID:  Larry Haynes, DOB 04-Jul-1946, MRN [id number]  PCP:  Larry Fusi, MD  Cardiologist:  Larry Herrlich, MD    Referring MD: Larry Fusi, MD    ASSESSMENT:    1. Sinus bradycardia   2. Essential hypertension   3. Chronic anticoagulation    PLAN:    In order of problems listed above:  I am going to refer him to EP for evaluation clearly has sinus node dysfunction multiple episodes of syncope and near syncope I am just unsure if there is enough here to justify pacemaker better served with an implanted loop recorder I am going apply an event monitor today live 2 weeks perhaps will get more information that will help Continue his current antihypertensives Continue long-term anticoagulation   Next appointment: 3 months   Medication Adjustments/Labs and Tests Ordered: Current medicines are reviewed at length with the patient today.  Concerns regarding medicines are outlined above.  Orders Placed This Encounter  Procedures   EKG 12-Lead   No orders of the defined types were placed in this encounter.    History of Present Illness:    Larry Haynes is a 77 y.o. male with a hx of hypertension hyperlipidemia type 2 diabetes and bradycardia last seen 09/17/2021.  Following that visit he had an event monitor reported 10/08/2021 with rare ventricular and supraventricular ectopy and sinus bradycardia with rates in the 30s nocturnally and 1 sinus pause of 3.6 seconds.  He was advised implanted loop recorder and declined he is subsequent admitted to the hospital April of this year with pulmonary emboli following a hospitalization with pancreatitis.  Compliance with diet, lifestyle and medications: Yes  He is not doing well he is now on long-term anticoagulation He complains of fairly of dizziness he has had episodes of syncope near syncope and multiple falls. He is positional weakness by my office today is 120/60 sitting  110/60 standing Previously had wanted him to have an implanted loop recorder and he declined He and his wife very much want to pursue this as he is afraid to be injured and unable to stay in his home Past Medical History:  Diagnosis Date   Aortic atherosclerosis (HCC) 2024   noted on CT imaging   Arthritis    Benign paroxysmal positional vertigo of left ear 09/15/2018   Benign prostatic hyperplasia with lower urinary tract symptoms    Bilateral hearing loss    Dizziness 09/15/2018   DM type 2 with diabetic dyslipidemia (HCC)    Elevated PSA    Essential hypertension 11/13/2015   Exertional headache 11/13/2015   Gout    Hyperkeratotic oral lesion 07/15/2015   Hyperlipemia    Hypertension    Panic attacks    Post-concussion headache    Presbycusis of both ears 09/15/2018   Spondylolisthesis at L5-S1 level 08/29/2017    Current Medications: Current Meds  Medication Sig   amLODipine (NORVASC) 10 MG tablet Take 10 mg daily by mouth.    apixaban (ELIQUIS) 5 MG TABS tablet Take 5 mg by mouth daily.   aspirin EC 81 MG tablet Take 81 mg by mouth daily. Swallow whole.   carbidopa-levodopa (SINEMET IR) 25-100 MG tablet Take 1 tablet by mouth 3 (three) times daily before meals.   colchicine 0.6 MG tablet Take 0.6 mg by mouth 2 (two) times daily as needed.   doxazosin (CARDURA) 2 MG tablet Take 2 mg by mouth at  bedtime.   furosemide (LASIX) 40 MG tablet Take 40 mg by mouth daily.   glimepiride (AMARYL) 1 MG tablet Take 1 mg by mouth daily.   hydrALAZINE (APRESOLINE) 50 MG tablet Take 50 mg by mouth 3 (three) times daily.   hydrOXYzine (ATARAX) 25 MG tablet Take 25 mg by mouth 4 (four) times daily as needed.   loratadine (CLARITIN) 10 MG tablet Take 10 mg by mouth daily.   rosuvastatin (CRESTOR) 20 MG tablet Take 20 mg by mouth daily.   sertraline (ZOLOFT) 100 MG tablet Take 100 mg by mouth daily.   tamsulosin (FLOMAX) 0.4 MG CAPS capsule Take 0.4 mg by mouth daily.       EKGs/Labs/Other Studies Reviewed:    The following studies were reviewed today:  Cardiac Studies & Procedures       ECHOCARDIOGRAM  ECHOCARDIOGRAM COMPLETE 10/05/2021  Narrative ECHOCARDIOGRAM REPORT    Patient Name:   Larry Haynes Date of Exam: 10/05/2021 Medical Rec #:  [id number]       Height:       67.0 in Accession #:    [id number]      Weight:       246.0 lb Date of Birth:  [birth year]        BSA:          2.208 m Patient Age:    75 years        BP:           146/72 mmHg Patient Gender: M               HR:           45 bpm. Exam Location:  [place]  Procedure: 2D Echo, Cardiac Doppler and Color Doppler  Indications:    Nonspecific abnormal electrocardiogram (ECG) (EKG) [R94.31 (ICD-10-CM)]; Near syncope Vera.August (ICD-10-CM)]  History:        Patient has no prior history of Echocardiogram examinations. Signs/Symptoms:Dizziness/Lightheadedness; Risk Factors:Hypertension and Diabetes.  Sonographer:    Larry Haynes RDCS Referring Phys: [id number] Larry Haynes  IMPRESSIONS   1. Left ventricular ejection fraction, by estimation, is 60 to 65%. The left ventricle has normal function. The left ventricle has no regional wall motion abnormalities. There is mild left ventricular hypertrophy. Left ventricular diastolic parameters are consistent with Grade II diastolic dysfunction (pseudonormalization). 2. Right ventricular systolic function is normal. The right ventricular size is normal. There is normal pulmonary artery systolic pressure. 3. The mitral valve is normal in structure. Mild mitral valve regurgitation. No evidence of mitral stenosis. 4. The aortic valve is normal in structure. Aortic valve regurgitation is not visualized. No aortic stenosis is present. 5. The inferior vena cava is normal in size with greater than 50% respiratory variability, suggesting right atrial pressure of 3 mmHg.  FINDINGS Left Ventricle: Left ventricular ejection fraction, by estimation, is 60 to  65%. The left ventricle has normal function. The left ventricle has no regional wall motion abnormalities. The left ventricular internal cavity size was normal in size. There is mild left ventricular hypertrophy. Left ventricular diastolic parameters are consistent with Grade II diastolic dysfunction (pseudonormalization).  Right Ventricle: The right ventricular size is normal. No increase in right ventricular wall thickness. Right ventricular systolic function is normal. There is normal pulmonary artery systolic pressure. The tricuspid regurgitant velocity is 1.44 m/s, and with an assumed right atrial pressure of 3 mmHg, the estimated right ventricular systolic pressure is 11.3 mmHg.  Left Atrium: Left atrial size was normal in  size.  Right Atrium: Right atrial size was normal in size.  Pericardium: There is no evidence of pericardial effusion.  Mitral Valve: The mitral valve is normal in structure. Mild mitral valve regurgitation. No evidence of mitral valve stenosis.  Tricuspid Valve: The tricuspid valve is normal in structure. Tricuspid valve regurgitation is not demonstrated. No evidence of tricuspid stenosis.  Aortic Valve: The aortic valve is normal in structure. Aortic valve regurgitation is not visualized. No aortic stenosis is present.  Pulmonic Valve: The pulmonic valve was normal in structure. Pulmonic valve regurgitation is not visualized. No evidence of pulmonic stenosis.  Aorta: The aortic root is normal in size and structure.  Venous: The inferior vena cava is normal in size with greater than 50% respiratory variability, suggesting right atrial pressure of 3 mmHg.  IAS/Shunts: No atrial level shunt detected by color flow Doppler.   LEFT VENTRICLE PLAX 2D LVIDd:         5.60 cm   Diastology LVIDs:         3.10 cm   LV e' medial:    5.22 cm/s LV PW:         1.30 cm   LV E/e' medial:  15.2 LV IVS:        1.30 cm   LV e' lateral:   6.09 cm/s LVOT diam:     2.00 cm   LV  E/e' lateral: 13.0 LV SV:         106 LV SV Index:   48 LVOT Area:     3.14 cm   RIGHT VENTRICLE             IVC RV S prime:     16.40 cm/s  IVC diam: 1.30 cm TAPSE (M-mode): 3.0 cm  LEFT ATRIUM             Index LA Vol (A2C):   78.5 ml 35.55 ml/m LA Vol (A4C):   67.8 ml 30.70 ml/m LA Biplane Vol: 73.6 ml 33.33 ml/m AORTIC VALVE LVOT Vmax:   129.00 cm/s LVOT Vmean:  90.000 cm/s LVOT VTI:    0.339 m  AORTA Ao Root diam: 2.90 cm Ao Asc diam:  3.30 cm Ao Desc diam: 2.10 cm  MITRAL VALVE               TRICUSPID VALVE MV Area (PHT): 2.97 cm    TR Peak grad:   8.3 mmHg MV Decel Time: 255 msec    TR Vmax:        144.00 cm/s MV E velocity: 79.30 cm/s MV A velocity: 82.70 cm/s  SHUNTS MV E/A ratio:  0.96        Systemic VTI:  0.34 m Systemic Diam: 2.00 cm  Gypsy Balsam MD Electronically signed by Gypsy Balsam MD Signature Date/Time: 10/05/2021/1:02:33 PM    Final    MONITORS  LONG TERM MONITOR-LIVE TELEMETRY (3-14 DAYS) 10/05/2021  Narrative Patch Wear Time:  12 days and 21 hours (2022-12-16T09:11:12-498 to 2022-12-29T06:43:09-499)  Patient had a min HR of 32 bpm, max HR of 138 bpm, and avg HR of 48 bpm. Predominant underlying rhythm was Sinus Rhythm. 69 Supraventricular Tachycardia runs occurred, the run with the fastest interval lasting 4 beats with a max rate of 138 bpm, the longest lasting 17 beats with an avg rate of 111 bpm. 1 Pause occurred lasting 3.6 secs (17 bpm). Isolated SVEs were rare (<1.0%), SVE Couplets were rare (<1.0%), and SVE Triplets were rare (<1.0%). Isolated VEs were rare (<1.0%),  VE Couplets were rare (<1.0%), and no VE Triplets were present. Ventricular Trigeminy was present.  There was 1 sinus pause noted in the early evening hours 7:13 PM 3.6 seconds.  Sinus bradycardia in the 30s occurred nocturnally  Ventricular ectopy was rare supraventricular ectopy was rare there were 4 brief runs of APCs the longest 17 complexes at a rate of 111  bpm  I reviewed my office note he had an episode of syncope in the setting of marked sinus bradycardia and I will advise him to be seen by EP for an implanted loop recorder           EKG Interpretation Date/Time:  Tuesday August 08 2023 15:16:24 EST Ventricular Rate:  52 PR Interval:  260 QRS Duration:  92 QT Interval:  448 QTC Calculation: 416 R Axis:   -49  Text Interpretation: Sinus bradycardia with 1st degree A-V block with Premature atrial complexes Left axis deviation When compared with ECG of 22-Aug-2017 16:11, Premature atrial complexes are now Present Confirmed by Larry Haynes ([id number]) on 08/08/2023 3:19:47 PM   Recent Labs: No results found for requested labs within last 365 days.  Recent Lipid Panel No results found for: "CHOL", "TRIG", "HDL", "CHOLHDL", "VLDL", "LDLCALC", "LDLDIRECT"  Physical Exam:    VS:  BP 128/68   Ht 5\' 8"  (1.727 m)   Wt 212 lb (96.2 kg)   BMI 32.23 kg/m     Wt Readings from Last 3 Encounters:  08/08/23 212 lb (96.2 kg)  01/16/23 225 lb (102.1 kg)  08/09/22 231 lb 6 oz (105 kg)     GEN:  Well nourished, well developed in no acute distress HEENT: Normal NECK: No JVD; No carotid bruits LYMPHATICS: No lymphadenopathy CARDIAC: RRR, no murmurs, rubs, gallops RESPIRATORY:  Clear to auscultation without rales, wheezing or rhonchi  ABDOMEN: Soft, non-tender, non-distended MUSCULOSKELETAL:  No edema; No deformity  SKIN: Warm and dry NEUROLOGIC:  Alert and oriented x 3 PSYCHIATRIC:  Normal affect    Signed, Larry Herrlich, MD  08/08/2023 3:42 PM    [facility]

## 2023-08-08 ENCOUNTER — Ambulatory Visit: Payer: Medicare Other | Attending: Cardiology | Admitting: Cardiology

## 2023-08-08 ENCOUNTER — Encounter: Payer: Self-pay | Admitting: Cardiology

## 2023-08-08 ENCOUNTER — Ambulatory Visit: Payer: Medicare Other | Attending: Cardiology

## 2023-08-08 VITALS — BP 128/58 | HR 52 | Ht 68.0 in | Wt 212.0 lb

## 2023-08-08 DIAGNOSIS — I10 Essential (primary) hypertension: Secondary | ICD-10-CM | POA: Diagnosis not present

## 2023-08-08 DIAGNOSIS — Z7901 Long term (current) use of anticoagulants: Secondary | ICD-10-CM

## 2023-08-08 DIAGNOSIS — E785 Hyperlipidemia, unspecified: Secondary | ICD-10-CM | POA: Diagnosis not present

## 2023-08-08 DIAGNOSIS — R001 Bradycardia, unspecified: Secondary | ICD-10-CM | POA: Diagnosis not present

## 2023-08-08 DIAGNOSIS — E1169 Type 2 diabetes mellitus with other specified complication: Secondary | ICD-10-CM | POA: Diagnosis not present

## 2023-08-08 NOTE — Patient Instructions (Signed)
Medication Instructions:  Your physician has recommended you make the following change in your medication:   STOP: Aspirin  *If you need a refill on your cardiac medications before your next appointment, please call your pharmacy*   Lab Work: None If you have labs (blood work) drawn today and your tests are completely normal, you will receive your results only by: MyChart Message (if you have MyChart) OR A paper copy in the mail If you have any lab test that is abnormal or we need to change your treatment, we will call you to review the results.   Testing/Procedures: A zio monitor was ordered today. It will remain on for 14 days. You will then return monitor and event diary in provided box. It takes 1-2 weeks for report to be downloaded and returned to [place]. We will call you with the results. If monitor falls off or has orange flashing light, please call [employer] for further instructions.     Follow-Up: At [facility], you and your health needs are our priority.  As part of our continuing mission to provide you with exceptional heart care, we have created designated Provider Care Teams.  These Care Teams include your primary Cardiologist (physician) and Advanced Practice Providers (APPs -  Physician Assistants and Nurse Practitioners) who all work together to provide you with the care you need, when you need it.  We recommend signing up for the patient portal called "MyChart".  Sign up information is provided on this After Visit Summary.  MyChart is used to connect with patients for Virtual Visits (Telemedicine).  Patients are able to view lab/test results, encounter notes, upcoming appointments, etc.  Non-urgent messages can be sent to your provider as well.   To learn more about what you can do with MyChart, go to [email or web address].    Your next appointment:   6 month(s)  Provider:   Norman Herrlich, MD    Other Instructions None

## 2023-08-09 DIAGNOSIS — I10 Essential (primary) hypertension: Secondary | ICD-10-CM | POA: Diagnosis not present

## 2023-08-09 DIAGNOSIS — R001 Bradycardia, unspecified: Secondary | ICD-10-CM | POA: Diagnosis not present

## 2023-08-14 ENCOUNTER — Ambulatory Visit: Payer: Medicare Other | Admitting: Cardiology

## 2023-08-14 ENCOUNTER — Encounter: Payer: Self-pay | Admitting: Diagnostic Neuroimaging

## 2023-08-14 ENCOUNTER — Ambulatory Visit: Payer: Medicare Other | Admitting: Diagnostic Neuroimaging

## 2023-08-14 VITALS — BP 121/60 | HR 51 | Ht 68.0 in | Wt 212.0 lb

## 2023-08-14 DIAGNOSIS — R55 Syncope and collapse: Secondary | ICD-10-CM | POA: Diagnosis not present

## 2023-08-14 DIAGNOSIS — G20C Parkinsonism, unspecified: Secondary | ICD-10-CM

## 2023-08-14 DIAGNOSIS — R269 Unspecified abnormalities of gait and mobility: Secondary | ICD-10-CM

## 2023-08-14 DIAGNOSIS — G959 Disease of spinal cord, unspecified: Secondary | ICD-10-CM | POA: Diagnosis not present

## 2023-08-14 MED ORDER — CARBIDOPA-LEVODOPA 25-100 MG PO TABS: 1.0000 | ORAL_TABLET | Freq: Three times a day (TID) | ORAL | 4 refills | Status: AC

## 2023-08-14 NOTE — Patient Instructions (Signed)
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   - Please maintain precautions. Do not participate in activities where a loss of awareness could harm you or someone else. No swimming alone, no tub bathing, no hot tubs, no driving, no operating motorized vehicles (cars, ATVs, motocycles, etc), lawnmowers, power tools or firearms. No standing at heights, such as rooftops, ladders or stairs. Avoid hot objects such as stoves, heaters, open fires. Wear a helmet when riding a bicycle, scooter, skateboard, etc. and avoid areas of traffic. Set your water heater to 120 degrees or less.

## 2023-08-14 NOTE — Progress Notes (Signed)
[facility]  PATIENT: Larry Haynes DOB: 01/05/1946  REFERRING CLINICIAN: Paulina Fusi, MD HISTORY FROM: PATIENT  REASON FOR VISIT: follow up   HISTORICAL  CHIEF COMPLAINT:  Chief Complaint  Patient presents with   Follow-up    Patient in room #7 with his wife. Patient states he having bad weakness in his legs and feeling a little imbalance and dizzy.    HISTORY OF PRESENT ILLNESS:   UPDATE (08/14/23, VRP): Since last visit, doing about the same. Still with gait and balance issues. Also with intermittent lightheaded and syncope events at home, sitting down. Now seeing cardiology. Last event was Sept 2024.   UPDATE (08/09/22, VRP): Since last visit, doing better on carb/levo. Still moving around, but balance is off. More fatigue. Not using walker that much.   PRIOR HPI (03/24/22): 77 year old male here for evaluation of gait and balance difficulty.  Has had chronic low back pain for about 10 years.  Has had progressive balance and gait difficulty over the last 3 to 4 years, especially in the last few months.  He was using a walker and cane in the past.  He has had multiple falls.  He feels like his legs are not doing what he is trying to tell him to do.   REVIEW OF SYSTEMS: Full 14 system review of systems performed and negative with exception of: as per HPI.  ALLERGIES: Allergies  Allergen Reactions   Ace Inhibitors Cough   Avelox [Moxifloxacin]    Losartan Potassium Cough   Percocet [Oxycodone-Acetaminophen]    Shrimp (Diagnostic)    Tomato    Other Hives    Hot fudge   Prednisone Other (See Comments)    hyperactivity    HOME MEDICATIONS: Outpatient Medications Prior to Visit  Medication Sig Dispense Refill   amLODipine (NORVASC) 10 MG tablet Take 10 mg daily by mouth.      apixaban (ELIQUIS) 5 MG TABS tablet Take 5 mg by mouth daily.     colchicine 0.6 MG tablet Take 0.6 mg by mouth 2 (two) times daily as needed.     doxazosin (CARDURA)  2 MG tablet Take 2 mg by mouth at bedtime.     furosemide (LASIX) 40 MG tablet Take 40 mg by mouth daily.     glimepiride (AMARYL) 1 MG tablet Take 1 mg by mouth daily.     hydrALAZINE (APRESOLINE) 50 MG tablet Take 50 mg by mouth 3 (three) times daily.     hydrOXYzine (ATARAX) 25 MG tablet Take 25 mg by mouth 4 (four) times daily as needed.     loratadine (CLARITIN) 10 MG tablet Take 10 mg by mouth daily as needed.     rosuvastatin (CRESTOR) 20 MG tablet Take 20 mg by mouth daily.     sertraline (ZOLOFT) 100 MG tablet Take 100 mg by mouth daily.     tamsulosin (FLOMAX) 0.4 MG CAPS capsule Take 0.4 mg by mouth daily.     carbidopa-levodopa (SINEMET IR) 25-100 MG tablet Take 1 tablet by mouth 3 (three) times daily before meals. 270 tablet 4   No facility-administered medications prior to visit.    PAST MEDICAL HISTORY: Past Medical History:  Diagnosis Date   Aortic atherosclerosis (HCC) 2024   noted on CT imaging   Arthritis    Benign paroxysmal positional vertigo of left ear 09/15/2018   Benign prostatic hyperplasia with lower urinary tract symptoms    Bilateral hearing loss    Dizziness 09/15/2018  DM type 2 with diabetic dyslipidemia (HCC)    Elevated PSA    Essential hypertension 11/13/2015   Exertional headache 11/13/2015   Gout    Hyperkeratotic oral lesion 07/15/2015   Hyperlipemia    Hypertension    Panic attacks    Post-concussion headache    Presbycusis of both ears 09/15/2018   Spondylolisthesis at L5-S1 level 08/29/2017    PAST SURGICAL HISTORY: Past Surgical History:  Procedure Laterality Date   CATARACT EXTRACTION Bilateral 2015   CHOLECYSTECTOMY  12/22/2022   [facility]   SKIN CANCER EXCISION     BOTTOM  LIP   SPINAL FUSION  08/29/2017   L5-S1 decompression and fusion by Julio Sicks   TONSILLECTOMY  1952   TOTAL KNEE ARTHROPLASTY Left 2010    FAMILY HISTORY: Family History  Problem Relation Age of Onset   Hypertension Mother    Heart  disease Mother    Prostate cancer Father    Diabetes Father     SOCIAL HISTORY: Social History   Socioeconomic History   Marital status: Married    Spouse name: Kathie Rhodes   Number of children: 2   Years of education: 12   Highest education level: Not on file  Occupational History    Comment: retired  Tobacco Use   Smoking status: Never    Passive exposure: Never   Smokeless tobacco: Never  Vaping Use   Vaping status: Never Used  Substance and Sexual Activity   Alcohol use: Never   Drug use: Never   Sexual activity: Not on file  Other Topics Concern   Not on file  Social History Narrative   Lives with wife   No caffeine   Social Determinants of Health   Financial Resource Strain: Not on file  Food Insecurity: Low Risk  (01/03/2023)   [facility]   Hunger Vital Sign    Worried About Running Out of Food in the Last Year: Never true    Ran Out of Food in the Last Year: Never true  Transportation Needs: Not on file (01/03/2023)  Physical Activity: Not on file  Stress: Not on file  Social Connections: Not on file  Intimate Partner Violence: Not on file     PHYSICAL EXAM  GENERAL EXAM/CONSTITUTIONAL: Vitals:  Vitals:   08/14/23 1556  BP: 121/60  Pulse: (!) 51  Weight: 212 lb (96.2 kg)  Height: 5\' 8"  (1.727 m)   Body mass index is 32.23 kg/m. Wt Readings from Last 3 Encounters:  08/14/23 212 lb (96.2 kg)  08/08/23 212 lb (96.2 kg)  01/16/23 225 lb (102.1 kg)   Patient is in no distress; well developed, nourished and groomed; neck is supple  CARDIOVASCULAR: Examination of carotid arteries is normal; no carotid bruits Regular rate and rhythm, no murmurs Examination of peripheral vascular system by observation and palpation is normal  EYES: Ophthalmoscopic exam of optic discs and posterior segments is normal; no papilledema or hemorrhages No results found.  MUSCULOSKELETAL: Gait, strength, tone, movements noted in Neurologic  exam below  NEUROLOGIC: MENTAL STATUS:      No data to display         awake, alert, oriented to person, place and time recent and remote memory intact normal attention and concentration language fluent, comprehension intact, naming intact fund of knowledge appropriate  CRANIAL NERVE:  2nd - no papilledema on fundoscopic exam 2nd, 3rd, 4th, 6th - pupils equal and reactive to light, visual fields full to confrontation,  extraocular muscles intact, no nystagmus 5th - facial sensation symmetric 7th - facial strength symmetric 8th - hearing intact 9th - palate elevates symmetrically, uvula midline 11th - shoulder shrug symmetric 12th - tongue protrusion midline  MOTOR:  normal bulk and tone, full strength in the BUE, BLE NO BRADYKINESIA INCREASED TONE IN BUE AND BLE  SENSORY:  normal and symmetric to light touch, temperature, vibration; EXCEPT DECR IN FEET  COORDINATION:  finger-nose-finger, fine finger movements normal  REFLEXES:  deep tendon reflexes TRACE and symmetric  GAIT/STATION:  WIDE based gait; STOOPED POSTURE; SLOW TO STAND; SHORT STEPS; EN BLOC TURNING     DIAGNOSTIC DATA (LABS, IMAGING, TESTING) - I reviewed patient records, labs, notes, testing and imaging myself where available.  Lab Results  Component Value Date   WBC 11.3 (H) 08/22/2017   HGB 16.2 08/22/2017   HCT 46.6 08/22/2017   MCV 92.8 08/22/2017   PLT 228 08/22/2017      Component Value Date/Time   NA 139 08/22/2017 1616   K 4.7 08/22/2017 1616   CL 104 08/22/2017 1616   CO2 25 08/22/2017 1616   GLUCOSE 121 (H) 08/22/2017 1616   BUN 14 08/22/2017 1616   CREATININE 1.08 08/22/2017 1616   CALCIUM 9.1 08/22/2017 1616   PROT 6.9 09/10/2014 1238   ALBUMIN 3.8 09/10/2014 1238   AST 33 09/10/2014 1238   ALT 45 09/10/2014 1238   ALKPHOS 59 09/10/2014 1238   BILITOT 0.6 09/10/2014 1238   GFRNONAA >60 08/22/2017 1616   GFRAA >60 08/22/2017 1616   No results found for: "CHOL", "HDL",  "LDLCALC", "LDLDIRECT", "TRIG", "CHOLHDL" No results found for: "HGBA1C" No results found for: "VITAMINB12" No results found for: "TSH"   07/31/15 MRI lumbar spine 1. Moderate multifactorial spinal stenosis at L3-4 and mild spinal stenosis at L2-3, both slightly increased with standing. 2. Bilateral L5 pars defects with grade 1 anterolisthesis and severe bilateral neural foraminal stenosis. 3. Chronic L2 fracture with unchanged right-sided vertebral body height loss.  08/29/21 MRI lumbar spine [I reviewed images myself and agree with interpretation. -VRP]  1. Status post L5-S1 PLIF. No spinal canal stenosis is seen at this level. Overall unchanged moderate bilateral neural foraminal narrowing. 2. L3-L4 mild spinal canal stenosis, which has progressed slightly from the prior exam. 3. L2-L3 mild spinal canal stenosis, unchanged. 4. Narrowing of the lateral recesses at L2-L3, L3-L4, and L4-L5 could affect the descending L3, L4, and L5 nerves, respectively.  02/24/22 MRI brain  1. No acute intracranial abnormality or significant interval change. 2. Stable moderate atrophy and white matter disease. This likely reflects the sequela of chronic microvascular ischemia. 3. 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    ASSESSMENT AND PLAN  77 y.o. year old male here with:  Dx:  1. Atypical parkinsonism (HCC)   2. Cervical myelopathy (HCC)   3. Gait difficulty   4. Syncope, unspecified syncope type      PLAN:  GAIT DIFFICULTY (multifactorial, deconditioning, chronic low back pain, arthritis; also with some short shuffling gait and en bloc turning; mild postural tremor; could represent underlying akinetic rigid parkinsonism) - check MRI cervical spine (rule out myelopathy) - continue PT exercises; use walker; consider [employer] exercise program and water therapy - continue carb/levo (some benefit in tremor and gait)  SYNCOPE EVENTS (lightheaded, bradycardia; last event Sept  2024)  - follow up with cardiology  - According to [facility] law, you can not drive unless you are seizure / syncope free for at  least 6 months and under physician's care.   - Please maintain precautions. Do not participate in activities where a loss of awareness could harm you or someone else. No swimming alone, no tub bathing, no hot tubs, no driving, no operating motorized vehicles (cars, ATVs, motocycles, etc), lawnmowers, power tools or firearms. No standing at heights, such as rooftops, ladders or stairs. Avoid hot objects such as stoves, heaters, open fires. Wear a helmet when riding a bicycle, scooter, skateboard, etc. and avoid areas of traffic. Set your water heater to 120 degrees or less.   Meds ordered this encounter  Medications   carbidopa-levodopa (SINEMET IR) 25-100 MG tablet    Sig: Take 1 tablet by mouth 3 (three) times daily before meals.    Dispense:  270 tablet    Refill:  4   Return in about 1 year (around 08/13/2024).    Suanne Marker, MD 08/14/2023, 4:26 PM Certified in Neurology, Neurophysiology and Neuroimaging  [facility] [address] [place], [place] [address] [phone]

## 2023-08-28 ENCOUNTER — Ambulatory Visit
Admission: RE | Admit: 2023-08-28 | Discharge: 2023-08-28 | Disposition: A | Discharge status: Home / Self Care | Payer: Medicare Other | Source: Ambulatory Visit | Attending: Diagnostic Neuroimaging | Admitting: Diagnostic Neuroimaging

## 2023-08-28 DIAGNOSIS — G959 Disease of spinal cord, unspecified: Secondary | ICD-10-CM

## 2023-08-28 DIAGNOSIS — M5000 Cervical disc disorder with myelopathy, unspecified cervical region: Secondary | ICD-10-CM | POA: Diagnosis not present

## 2023-09-05 ENCOUNTER — Other Ambulatory Visit: Payer: Self-pay

## 2023-09-05 MED ORDER — METOPROLOL SUCCINATE ER 25 MG PO TB24: 12.5000 mg | ORAL_TABLET | Freq: Every day | ORAL | 3 refills | Status: DC

## 2023-09-07 ENCOUNTER — Telehealth: Payer: Self-pay

## 2023-09-07 NOTE — Telephone Encounter (Signed)
I called patient. I spoke with patient's wife, Kathie Rhodes, per DPR. I discussed patient's results and recommendations with her. She also doesn't think patient has seen neurosurgery before but would like an appointment. [facility] referral will be placed. Patient's wife is aware that the [facility] office will call her to schedule the appointment. Pt's wife verbalized understanding of results. Pt's wife had no questions at this time but was encouraged to call back if questions arise.

## 2023-09-07 NOTE — Telephone Encounter (Signed)
I called patient. I discussed his MRI cervical spine results with him. Patient does not think he has seen a neurosurgeon. However, he asked that I call back in about 30 minutes and discuss this further with his wife.

## 2023-09-07 NOTE — Telephone Encounter (Signed)
-----   Message from [facility] sent at 09/06/2023  4:34 PM EST ----- Mild-moderate spinal stenosis in the neck.  Consider follow up with neurosurgery. -VRP

## 2023-09-11 ENCOUNTER — Ambulatory Visit: Payer: Medicare Other | Admitting: Cardiology

## 2023-10-09 ENCOUNTER — Telehealth: Payer: Self-pay | Admitting: Diagnostic Neuroimaging

## 2023-10-09 DIAGNOSIS — R269 Unspecified abnormalities of gait and mobility: Secondary | ICD-10-CM

## 2023-10-09 DIAGNOSIS — G959 Disease of spinal cord, unspecified: Secondary | ICD-10-CM

## 2023-10-09 DIAGNOSIS — G20C Parkinsonism, unspecified: Secondary | ICD-10-CM

## 2023-10-09 NOTE — Telephone Encounter (Signed)
 Pt's wife, Roshawn Ayala calling to check on referral  for neurosurgery. Ms. Minerd discussed with Dr. Marjory Lies to send a neurosurgery for his neck.  Informed Ms. Hornbeck will send a message to nurse about referral.

## 2023-10-09 NOTE — Telephone Encounter (Signed)
 Referral has been placed and routed to Dr Marjory Lies to [place]. I will inform the referrals team so they can work on this asap.

## 2023-10-09 NOTE — Telephone Encounter (Signed)
 Called patient to informed his wife that an referral has been place for the neurosurgeon. Pt wife verbalized understanding. Pt wife had no questions at this time but was encouraged to call back if questions arise.

## 2023-10-11 NOTE — Telephone Encounter (Signed)
 Referral for neurosurgery fax to [facility]. Phone: [phone], Fax: [phone]

## 2023-10-24 ENCOUNTER — Encounter: Payer: Self-pay | Admitting: Cardiology

## 2023-10-24 ENCOUNTER — Ambulatory Visit: Payer: Medicare Other | Attending: Cardiology | Admitting: Cardiology

## 2023-10-24 VITALS — BP 126/56 | HR 48 | Ht 68.0 in | Wt 212.6 lb

## 2023-10-24 DIAGNOSIS — R55 Syncope and collapse: Secondary | ICD-10-CM | POA: Diagnosis not present

## 2023-10-24 NOTE — Progress Notes (Signed)
Electrophysiology Office Note:   Date:  10/24/2023  ID:  Larry Haynes, DOB 1946/08/29, MRN [id number]  Primary Cardiologist: Norman Herrlich, MD Primary Heart Failure: None Electrophysiologist: None      History of Present Illness:   Larry Haynes is a 78 y.o. male with h/o hypertension, hyperlipidemia, diabetes, PE, Parkinson's disease, syncope seen today for  for Electrophysiology evaluation of syncope at the request of Norman Herrlich.    In 2022 he had a syncopal episode.  This was thought related to bradycardia.  He was initially evaluated and was found to have orthostatic syncope.  He is continue to have episodes of syncope.  Some episodes occur when he changes position, but he has also had episodes of syncope when he is sitting down.  The patient does not remember these episodes, but his wife is quite clear that he has had episodes of syncope and has been unable to be aroused.  He does have Parkinson's, and he feels that this is worsened since his diagnosis.  Review of systems complete and found to be negative unless listed in HPI.   EP Information / Studies Reviewed:    EKG is not ordered today. EKG from 08/08/23 reviewed which showed sinus rhythm, rate 52, 1dAVB        Risk Assessment/Calculations:              Physical Exam:   VS:  BP (!) 126/56 (BP Location: Right Arm, Patient Position: Sitting, Cuff Size: Large)   Pulse (!) 48   Ht 5\' 8"  (1.727 m)   Wt 212 lb 9.6 oz (96.4 kg)   SpO2 97%   BMI 32.33 kg/m    Wt Readings from Last 3 Encounters:  10/24/23 212 lb 9.6 oz (96.4 kg)  08/14/23 212 lb (96.2 kg)  08/08/23 212 lb (96.2 kg)     GEN: Well nourished, well developed in no acute distress NECK: No JVD; No carotid bruits CARDIAC: Regular rate and rhythm, no murmurs, rubs, gallops RESPIRATORY:  Clear to auscultation without rales, wheezing or rhonchi  ABDOMEN: Soft, non-tender, non-distended EXTREMITIES:  No edema; No deformity   ASSESSMENT AND PLAN:    1.   Recurrent syncope: Has had multiple episodes of recurrent syncope.  Some episodes occur when he changes position, that he is also had episodes of syncope while sitting.  Both the patient and his wife are concerned over this.  He has worn a cardiac monitor which was unrevealing.  Due to that, he would benefit from ILR implant.  Risk and benefits of been discussed.  He understands the risks and is agreed to the procedure.  Risk include bleeding and infection.  2.  Hypertension: Currently well-controlled  Follow up with Dr. Elberta Fortis  pending ILR results   Signed, Klea Nall Jorja Loa, MD   SURGEON:  Daleyssa Loiselle Jorja Loa, MD     PREPROCEDURE DIAGNOSIS:  Syncope    POSTPROCEDURE DIAGNOSIS: Syncope     PROCEDURES:   1. Implantable loop recorder implantation    INTRODUCTION:  Larry Haynes presents with a history of syncope The costs of loop recorder monitoring have been discussed with the patient.    DESCRIPTION OF PROCEDURE:  Informed written consent was obtained.   Time Out Completed with RN    The patient required no sedation for the procedure today.  Mapping over the patient's chest was performed to identify the area where electrograms were most prominent for ILR recording.  This area was found to be the left  parasternal region over the 4th intercostal space. The patients left chest was therefore prepped and draped in the usual sterile fashion. The skin overlying the left parasternal region was infiltrated with lidocaine for local analgesia.  A 0.5-cm incision was made over the left parasternal region over the 3rd intercostal space.  A subcutaneous ILR pocket was fashioned using a combination of sharp and blunt dissection.  A Medtronic Reveal LINQ 2 implantable loop recorder (serial # [id number]) was then placed into the pocket  R waves were very prominent and measuring  0.28 .  Steri- Strips and a sterile dressing were then applied.  There were no early apparent complications.      CONCLUSIONS:   1. Successful implantation of a implantable loop recorder for a history of Syncope.  2. No early apparent complications.   Larry Haynes Jorja Loa, MD  Cardiac Electrophysiology

## 2023-10-24 NOTE — Patient Instructions (Signed)
Medication Instructions:  Your physician recommends that you continue on your current medications as directed. Please refer to the Current Medication list given to you today.  *If you need a refill on your cardiac medications before your next appointment, please call your pharmacy*   Lab Work: None ordered.  If you have labs (blood work) drawn today and your tests are completely normal, you will receive your results only by: MyChart Message (if you have MyChart) OR A paper copy in the mail If you have any lab test that is abnormal or we need to change your treatment, we will call you to review the results.   Testing/Procedures: None ordered.    Follow-Up: At [facility], you and your health needs are our priority.  As part of our continuing mission to provide you with exceptional heart care, we have created designated Provider Care Teams.  These Care Teams include your primary Cardiologist (physician) and Advanced Practice Providers (APPs -  Physician Assistants and Nurse Practitioners) who all work together to provide you with the care you need, when you need it.  We recommend signing up for the patient portal called "MyChart".  Sign up information is provided on this After Visit Summary.  MyChart is used to connect with patients for Virtual Visits (Telemedicine).  Patients are able to view lab/test results, encounter notes, upcoming appointments, etc.  Non-urgent messages can be sent to your provider as well.   To learn more about what you can do with MyChart, go to [email or web address].    Your next appointment:   As needed with Dr Elberta Fortis     Implantable Loop Recorder Placement, Care After This sheet gives you information about how to care for yourself after your procedure. Your health care provider may also give you more specific instructions. If you have problems or questions, contact your health care provider. What can I expect after the procedure? After the  procedure, it is common to have: Soreness or discomfort near the incision. Some swelling or bruising near the incision.  Follow these instructions at home: Incision care  Monitor your cardiac device site for redness, swelling, and drainage. Call the device clinic at [phone] if you experience these symptoms or fever/chills.  Keep the large square bandage on your site for 24 hours and then you may remove it yourself. Keep the steri-strips underneath in place.   You may shower after 72 hours / 3 days from your procedure with the steri-strips in place. They will usually fall off on their own, or may be removed after 10 days. Pat dry.   Avoid lotions, ointments, or perfumes over your incision until it is well-healed.  Please do not submerge in water until your site is completely healed.   Your device is MRI compatible.   Remote monitoring is used to monitor your cardiac device from home. This monitoring is scheduled every month by our office. It allows [place] to keep an eye on the function of your device to ensure it is working properly.  If your wound site starts to bleed apply pressure.    For help with the monitor please call Medtronic Monitor Support Specialist directly at [phone].    If you have any questions/concerns please call the device clinic at [phone].  Activity  Return to your normal activities.  General instructions Follow instructions from your health care provider about how to manage your implantable loop recorder and transmit the information. Learn how to activate a recording if this is necessary for  your type of device. You may go through a metal detection gate, and you may let someone hold a metal detector over your chest. Show your ID card if needed. Do not have an MRI unless you check with your health care provider first. Take over-the-counter and prescription medicines only as told by your health care provider. Keep all follow-up visits as told by your  health care provider. This is important. Contact a health care provider if: You have redness, swelling, or pain around your incision. You have a fever. You have pain that is not relieved by your pain medicine. You have triggered your device because of fainting (syncope) or because of a heartbeat that feels like it is racing, slow, fluttering, or skipping (palpitations). Get help right away if you have: Chest pain. Difficulty breathing. Summary After the procedure, it is common to have soreness or discomfort near the incision. Change your dressing as told by your health care provider. Follow instructions from your health care provider about how to manage your implantable loop recorder and transmit the information. Keep all follow-up visits as told by your health care provider. This is important. This information is not intended to replace advice given to you by your health care provider. Make sure you discuss any questions you have with your health care provider. Document Released: 08/31/2015 Document Revised: 11/04/2017 Document Reviewed: 11/04/2017 Elsevier Patient Education  2020 [employer].

## 2023-10-25 DIAGNOSIS — M47812 Spondylosis without myelopathy or radiculopathy, cervical region: Secondary | ICD-10-CM | POA: Diagnosis not present

## 2023-10-30 ENCOUNTER — Telehealth: Payer: Self-pay

## 2023-10-30 NOTE — Telephone Encounter (Signed)
Patient's spouse is returning phone call.

## 2023-10-30 NOTE — Telephone Encounter (Signed)
Marland Kitchen  patient is returning phone call

## 2023-10-30 NOTE — Telephone Encounter (Signed)
Patient goes to bed around 9pm and wakes up at 8:00 am. Huston Foley events appear nocturnal.  Wife reports patient "he goes limp, not passing out all the way but doesn't know I am there." Although denies syncope.  Wife states she thinks patient had an episode as stated about on 10/27/23 but unsure about the time. Encouraged to log date and time so we can attempt to correlate symptoms to episodes. Will continue to monitor remotely.  Routing to Dr. Elberta Fortis.

## 2023-10-30 NOTE — Telephone Encounter (Addendum)
Alert received from [employer] for ILR pause 10/29/23 @ 09:09 am, duration 3 seconds. ILR implanted for syncope and 1st pause noted. 10/26/23 ~ 3 brady episodes detected at approximately 1610-9604, these are SB with 1 AVB at 30-40 bpm, this may be during sleeping hours.   There was one tachycardia event that was a narrow complex ventricular arrhythmia, average heart rates 158 bpm for 6 minutes and 7 seconds.    Need to verify sleeping hours.  Attempted to call pt to inquire further. No answer, LMTCB.

## 2023-10-31 NOTE — Telephone Encounter (Signed)
States the time frame was evening. Continue to monitor.

## 2023-11-14 DIAGNOSIS — E785 Hyperlipidemia, unspecified: Secondary | ICD-10-CM | POA: Diagnosis not present

## 2023-11-14 DIAGNOSIS — G20A1 Parkinson's disease without dyskinesia, without mention of fluctuations: Secondary | ICD-10-CM | POA: Diagnosis not present

## 2023-11-14 DIAGNOSIS — I10 Essential (primary) hypertension: Secondary | ICD-10-CM | POA: Diagnosis not present

## 2023-11-14 DIAGNOSIS — R600 Localized edema: Secondary | ICD-10-CM | POA: Diagnosis not present

## 2023-11-14 DIAGNOSIS — Z86718 Personal history of other venous thrombosis and embolism: Secondary | ICD-10-CM | POA: Diagnosis not present

## 2023-11-14 DIAGNOSIS — E1169 Type 2 diabetes mellitus with other specified complication: Secondary | ICD-10-CM | POA: Diagnosis not present

## 2023-11-14 DIAGNOSIS — Z86711 Personal history of pulmonary embolism: Secondary | ICD-10-CM | POA: Diagnosis not present

## 2023-11-14 DIAGNOSIS — R001 Bradycardia, unspecified: Secondary | ICD-10-CM | POA: Diagnosis not present

## 2023-11-23 ENCOUNTER — Telehealth: Payer: Self-pay

## 2023-11-23 NOTE — Telephone Encounter (Signed)
Alert received from [employer] for 1 brady detection on 2/19 at 1335 x 10 sec with V-rates 40s.  Likely U waves present; sending to triage to further review as non conducted P waves cannot be excluded.  Spoke to patients wife who states patient was not feeling well yesterday and was sleeping during this time. States he has seen PCP earlier this week due to not feeling well. Advised to call if any further questions or concerns arise.

## 2023-12-04 ENCOUNTER — Ambulatory Visit (INDEPENDENT_AMBULATORY_CARE_PROVIDER_SITE_OTHER): Payer: Medicare Other

## 2023-12-04 DIAGNOSIS — R55 Syncope and collapse: Secondary | ICD-10-CM | POA: Diagnosis not present

## 2023-12-04 LAB — CUP PACEART REMOTE DEVICE CHECK
Date Time Interrogation Session: 20250228215114
Date Time Interrogation Session: 20250301000051
Implantable Pulse Generator Implant Date: 20250121
Implantable Pulse Generator Implant Date: 20250121
Zone Setting Status: 755011
Zone Setting Status: 755011
Zone Setting Status: 755011
Zone Setting Status: 755011

## 2023-12-06 ENCOUNTER — Emergency Department (HOSPITAL_COMMUNITY)

## 2023-12-06 ENCOUNTER — Inpatient Hospital Stay (HOSPITAL_COMMUNITY)
Admission: EM | Admit: 2023-12-06 | Discharge: 2023-12-08 | DRG: 244 | Disposition: A | Discharge status: Home / Self Care | Attending: Cardiology | Admitting: Cardiology

## 2023-12-06 ENCOUNTER — Telehealth: Payer: Self-pay

## 2023-12-06 ENCOUNTER — Other Ambulatory Visit: Payer: Self-pay

## 2023-12-06 ENCOUNTER — Encounter (HOSPITAL_COMMUNITY): Payer: Self-pay

## 2023-12-06 ENCOUNTER — Telehealth: Payer: Self-pay | Admitting: Cardiology

## 2023-12-06 DIAGNOSIS — Z91013 Allergy to seafood: Secondary | ICD-10-CM

## 2023-12-06 DIAGNOSIS — Z950 Presence of cardiac pacemaker: Secondary | ICD-10-CM | POA: Diagnosis not present

## 2023-12-06 DIAGNOSIS — Z8249 Family history of ischemic heart disease and other diseases of the circulatory system: Secondary | ICD-10-CM

## 2023-12-06 DIAGNOSIS — Z85828 Personal history of other malignant neoplasm of skin: Secondary | ICD-10-CM | POA: Diagnosis not present

## 2023-12-06 DIAGNOSIS — E1169 Type 2 diabetes mellitus with other specified complication: Secondary | ICD-10-CM | POA: Diagnosis not present

## 2023-12-06 DIAGNOSIS — Z885 Allergy status to narcotic agent status: Secondary | ICD-10-CM | POA: Diagnosis not present

## 2023-12-06 DIAGNOSIS — Z7982 Long term (current) use of aspirin: Secondary | ICD-10-CM

## 2023-12-06 DIAGNOSIS — R55 Syncope and collapse: Principal | ICD-10-CM

## 2023-12-06 DIAGNOSIS — Z9842 Cataract extraction status, left eye: Secondary | ICD-10-CM

## 2023-12-06 DIAGNOSIS — I10 Essential (primary) hypertension: Secondary | ICD-10-CM | POA: Diagnosis present

## 2023-12-06 DIAGNOSIS — Z888 Allergy status to other drugs, medicaments and biological substances status: Secondary | ICD-10-CM

## 2023-12-06 DIAGNOSIS — Z96652 Presence of left artificial knee joint: Secondary | ICD-10-CM | POA: Diagnosis present

## 2023-12-06 DIAGNOSIS — H9193 Unspecified hearing loss, bilateral: Secondary | ICD-10-CM | POA: Diagnosis present

## 2023-12-06 DIAGNOSIS — Z6832 Body mass index (BMI) 32.0-32.9, adult: Secondary | ICD-10-CM | POA: Diagnosis not present

## 2023-12-06 DIAGNOSIS — Z7984 Long term (current) use of oral hypoglycemic drugs: Secondary | ICD-10-CM

## 2023-12-06 DIAGNOSIS — Z833 Family history of diabetes mellitus: Secondary | ICD-10-CM | POA: Diagnosis not present

## 2023-12-06 DIAGNOSIS — E785 Hyperlipidemia, unspecified: Secondary | ICD-10-CM | POA: Diagnosis not present

## 2023-12-06 DIAGNOSIS — R001 Bradycardia, unspecified: Secondary | ICD-10-CM | POA: Diagnosis not present

## 2023-12-06 DIAGNOSIS — I495 Sick sinus syndrome: Principal | ICD-10-CM | POA: Diagnosis present

## 2023-12-06 DIAGNOSIS — G20A1 Parkinson's disease without dyskinesia, without mention of fluctuations: Secondary | ICD-10-CM | POA: Diagnosis not present

## 2023-12-06 DIAGNOSIS — Z981 Arthrodesis status: Secondary | ICD-10-CM | POA: Diagnosis not present

## 2023-12-06 DIAGNOSIS — Z86718 Personal history of other venous thrombosis and embolism: Secondary | ICD-10-CM | POA: Diagnosis not present

## 2023-12-06 DIAGNOSIS — Z7901 Long term (current) use of anticoagulants: Secondary | ICD-10-CM | POA: Diagnosis not present

## 2023-12-06 DIAGNOSIS — I700 Atherosclerosis of aorta: Secondary | ICD-10-CM | POA: Diagnosis not present

## 2023-12-06 DIAGNOSIS — Z86711 Personal history of pulmonary embolism: Secondary | ICD-10-CM

## 2023-12-06 DIAGNOSIS — R42 Dizziness and giddiness: Secondary | ICD-10-CM | POA: Diagnosis not present

## 2023-12-06 DIAGNOSIS — Z79899 Other long term (current) drug therapy: Secondary | ICD-10-CM

## 2023-12-06 DIAGNOSIS — E669 Obesity, unspecified: Secondary | ICD-10-CM | POA: Diagnosis present

## 2023-12-06 DIAGNOSIS — Z9841 Cataract extraction status, right eye: Secondary | ICD-10-CM | POA: Diagnosis not present

## 2023-12-06 DIAGNOSIS — I517 Cardiomegaly: Secondary | ICD-10-CM | POA: Diagnosis not present

## 2023-12-06 HISTORY — DX: Syncope and collapse: R55

## 2023-12-06 LAB — CBC WITH DIFFERENTIAL/PLATELET
Abs Immature Granulocytes: 0.04 10*3/uL (ref 0.00–0.07)
Basophils Absolute: 0.1 10*3/uL (ref 0.0–0.1)
Basophils Relative: 1 %
Eosinophils Absolute: 0.2 10*3/uL (ref 0.0–0.5)
Eosinophils Relative: 3 %
HCT: 38.4 % — ABNORMAL LOW (ref 39.0–52.0)
Hemoglobin: 12.6 g/dL — ABNORMAL LOW (ref 13.0–17.0)
Immature Granulocytes: 1 %
Lymphocytes Relative: 31 %
Lymphs Abs: 2.1 10*3/uL (ref 0.7–4.0)
MCH: 29.1 pg (ref 26.0–34.0)
MCHC: 32.8 g/dL (ref 30.0–36.0)
MCV: 88.7 fL (ref 80.0–100.0)
Monocytes Absolute: 0.5 10*3/uL (ref 0.1–1.0)
Monocytes Relative: 7 %
Neutro Abs: 4 10*3/uL (ref 1.7–7.7)
Neutrophils Relative %: 57 %
Platelets: 292 10*3/uL (ref 150–400)
RBC: 4.33 MIL/uL (ref 4.22–5.81)
RDW: 15.8 % — ABNORMAL HIGH (ref 11.5–15.5)
WBC: 6.8 10*3/uL (ref 4.0–10.5)
nRBC: 0 % (ref 0.0–0.2)

## 2023-12-06 LAB — COMPREHENSIVE METABOLIC PANEL
ALT: 9 U/L (ref 0–44)
AST: 18 U/L (ref 15–41)
Albumin: 3.2 g/dL — ABNORMAL LOW (ref 3.5–5.0)
Alkaline Phosphatase: 54 U/L (ref 38–126)
Anion gap: 12 (ref 5–15)
BUN: 24 mg/dL — ABNORMAL HIGH (ref 8–23)
CO2: 26 mmol/L (ref 22–32)
Calcium: 8.9 mg/dL (ref 8.9–10.3)
Chloride: 103 mmol/L (ref 98–111)
Creatinine, Ser: 1.23 mg/dL (ref 0.61–1.24)
GFR, Estimated: 60 mL/min — ABNORMAL LOW (ref >60)
Glucose, Bld: 95 mg/dL (ref 70–99)
Potassium: 3.8 mmol/L (ref 3.5–5.1)
Sodium: 141 mmol/L (ref 135–145)
Total Bilirubin: 0.5 mg/dL (ref 0.0–1.2)
Total Protein: 6.5 g/dL (ref 6.5–8.1)

## 2023-12-06 LAB — MAGNESIUM: Magnesium: 2.3 mg/dL (ref 1.7–2.4)

## 2023-12-06 LAB — TROPONIN I (HIGH SENSITIVITY)
Troponin I (High Sensitivity): 7 ng/L (ref <18)
Troponin I (High Sensitivity): 8 ng/L (ref <18)

## 2023-12-06 LAB — CBG MONITORING, ED
Glucose-Capillary: 80 mg/dL (ref 70–99)
Glucose-Capillary: 123 mg/dL — ABNORMAL HIGH (ref 70–99)

## 2023-12-06 MED ORDER — INSULIN ASPART 100 UNIT/ML IJ SOLN: 0.0000 [IU] | Freq: Three times a day (TID) | INTRAMUSCULAR | Status: DC

## 2023-12-06 MED ORDER — CARBIDOPA-LEVODOPA 25-100 MG PO TABS
1.0000 | ORAL_TABLET | Freq: Three times a day (TID) | ORAL | Status: DC
Start: 2023-12-07 — End: 2023-12-08
  Administered 2023-12-07 – 2023-12-08 (×3): 1 via ORAL
  Filled 2023-12-06 (×3): qty 1

## 2023-12-06 MED ORDER — TAMSULOSIN HCL 0.4 MG PO CAPS
0.4000 mg | ORAL_CAPSULE | Freq: Every day | ORAL | Status: DC
  Administered 2023-12-07 – 2023-12-08 (×2): 0.4 mg via ORAL
  Filled 2023-12-06 (×2): qty 1

## 2023-12-06 MED ORDER — ACETAMINOPHEN 325 MG PO TABS: 650.0000 mg | ORAL_TABLET | ORAL | Status: DC | PRN

## 2023-12-06 MED ORDER — ROSUVASTATIN CALCIUM 20 MG PO TABS
20.0000 mg | ORAL_TABLET | Freq: Every day | ORAL | Status: DC
  Administered 2023-12-07 – 2023-12-08 (×2): 20 mg via ORAL
  Filled 2023-12-06 (×2): qty 1

## 2023-12-06 MED ORDER — HEPARIN (PORCINE) 25000 UT/250ML-% IV SOLN
1350.0000 [IU]/h | INTRAVENOUS | Status: DC
  Administered 2023-12-06: 1200 [IU]/h via INTRAVENOUS
  Filled 2023-12-06: qty 250

## 2023-12-06 MED ORDER — PANTOPRAZOLE SODIUM 40 MG PO TBEC
40.0000 mg | DELAYED_RELEASE_TABLET | Freq: Every day | ORAL | Status: DC
  Administered 2023-12-07 – 2023-12-08 (×2): 40 mg via ORAL
  Filled 2023-12-06 (×2): qty 1

## 2023-12-06 MED ORDER — SERTRALINE HCL 100 MG PO TABS
100.0000 mg | ORAL_TABLET | Freq: Every day | ORAL | Status: DC
  Administered 2023-12-07 – 2023-12-08 (×2): 100 mg via ORAL
  Filled 2023-12-06 (×2): qty 1

## 2023-12-06 MED ORDER — ONDANSETRON HCL 4 MG/2ML IJ SOLN: 4.0000 mg | Freq: Four times a day (QID) | INTRAMUSCULAR | Status: DC | PRN

## 2023-12-06 MED ORDER — HEPARIN SODIUM (PORCINE) 5000 UNIT/ML IJ SOLN: 5000.0000 [IU] | Freq: Three times a day (TID) | INTRAMUSCULAR | Status: DC

## 2023-12-06 NOTE — H&P (Addendum)
 Cardiology Admission History and Physical   Patient ID: Larry Haynes MRN: [id number]; DOB: 10-29-1945   Admission date: 12/06/2023  PCP:  Larry Fusi, MD   [facility] Providers Cardiologist:  Larry Herrlich, MD   Chief Complaint:  bradycardia, syncope  Patient Profile:   Larry Haynes is a 78 y.o. male with hypertension, hyperlipidemia, DM, PE, Parkinson's disease, and syncope who is being seen 12/06/2023 for the evaluation of syncope and pauses.  History of Present Illness:   Larry Haynes has a history of submassive PE following lap chole. CTA in 01/2023 showed acute PE with RH strain. He was anticoagulated and discharged on eliquis. Echocardiogram that admission showed LVEF 55-60% with normal RV size and function, mild MR.  He was recently seen by Dr. Elberta Haynes on 10/24/2023 for history of syncope.  He has a history of what sounds like orthostatic syncope.  An implantable loop recorder was placed.   To date ILR check on 12/01/2023 showed 1 new sinus pause of 4 seconds.  Today, he was sitting on his porch and suddenly felt lightheaded and had increased fatigue.  He states he went to lay down but had reduced consciousness, not complete LOC.  His wife checked his blood pressure which was reportedly 137/52 but heart rate would not register on his BP machine.  She checked a manual pulse and heart rate was 35 bpm.  She  called 911. Upon arrival, he is sinus bradycardia with artifact on telemetry but no clear heart block.  ILR transmission received does show evidence of high grade heart block and a pause, but these were captured prior to his event at 2:18 pm.   Past Medical History:  Diagnosis Date   Aortic atherosclerosis (HCC) 2024   noted on CT imaging   Arthritis    Benign paroxysmal positional vertigo of left ear 09/15/2018   Benign prostatic hyperplasia with lower urinary tract symptoms    Bilateral hearing loss    Dizziness 09/15/2018   DM type 2 with diabetic  dyslipidemia (HCC)    Elevated PSA    Essential hypertension 11/13/2015   Exertional headache 11/13/2015   Gout    Hyperkeratotic oral lesion 07/15/2015   Hyperlipemia    Hypertension    Panic attacks    Post-concussion headache    Presbycusis of both ears 09/15/2018   Spondylolisthesis at L5-S1 level 08/29/2017    Past Surgical History:  Procedure Laterality Date   CATARACT EXTRACTION Bilateral 2015   CHOLECYSTECTOMY  12/22/2022   [facility]   SKIN CANCER EXCISION     BOTTOM  LIP   SPINAL FUSION  08/29/2017   L5-S1 decompression and fusion by Larry Haynes   TONSILLECTOMY  1952   TOTAL KNEE ARTHROPLASTY Left 2010     Medications Prior to Admission: Prior to Admission medications   Medication Sig Start Date End Date Taking? Authorizing Provider  amLODipine (NORVASC) 10 MG tablet Take 10 mg daily by mouth.  05/11/17   [provider]  apixaban (ELIQUIS) 5 MG TABS tablet Take 5 mg by mouth daily.    [provider]  carbidopa-levodopa (SINEMET IR) 25-100 MG tablet Take 1 tablet by mouth 3 (three) times daily before meals. 08/14/23 11/06/24  Haynes, Larry Bayley, MD  colchicine 0.6 MG tablet Take 0.6 mg by mouth 2 (two) times daily as needed. 05/16/22   [provider]  doxazosin (CARDURA) 2 MG tablet Take 2 mg by mouth at bedtime. 06/09/21   [provider]  furosemide (LASIX) 40 MG tablet Take 40 mg by mouth daily.    [provider]  glimepiride (AMARYL) 1 MG tablet Take 1 mg by mouth daily. 05/25/21   [provider]  hydrALAZINE (APRESOLINE) 50 MG tablet Take 50 mg by mouth 3 (three) times daily. 04/21/21   [provider]  hydrOXYzine (ATARAX) 25 MG tablet Take 25 mg by mouth 4 (four) times daily as needed. 02/14/22   [provider]  loratadine (CLARITIN) 10 MG tablet Take 10 mg by mouth daily as needed. 02/21/22   [provider]  metoprolol succinate (TOPROL XL) 25 MG 24 hr tablet Take 0.5 tablets  (12.5 mg total) by mouth daily. 09/05/23   Larry Daub, MD  pantoprazole (PROTONIX) 40 MG tablet Take 40 mg by mouth as needed. 08/07/23   [provider]  potassium chloride (KLOR-CON) 10 MEQ tablet Take 10 mEq by mouth daily. 07/25/23   [provider]  rosuvastatin (CRESTOR) 20 MG tablet Take 20 mg by mouth daily.    [provider]  sertraline (ZOLOFT) 100 MG tablet Take 100 mg by mouth daily. 02/21/22   [provider]  tamsulosin (FLOMAX) 0.4 MG CAPS capsule Take 0.4 mg by mouth daily. 08/04/21   [provider]     Allergies:    Allergies  Allergen Reactions   Ace Inhibitors Cough   Avelox [Moxifloxacin]    Losartan Potassium Cough   Percocet [Oxycodone-Acetaminophen]    Shrimp (Diagnostic)    Tomato    Other Hives    Hot fudge   Prednisone Other (See Comments)    hyperactivity    Social History:   Social History   Socioeconomic History   Marital status: Married    Spouse name: Larry Haynes   Number of children: 2   Years of education: 12   Highest education level: Not on file  Occupational History    Comment: retired  Tobacco Use   Smoking status: Never    Passive exposure: Never   Smokeless tobacco: Never  Vaping Use   Vaping status: Never Used  Substance and Sexual Activity   Alcohol use: Never   Drug use: Never   Sexual activity: Not on file  Other Topics Concern   Not on file  Social History Narrative   Lives with wife   No caffeine   Social Drivers of Corporate investment banker Strain: Not on file  Food Insecurity: Low Risk  (01/03/2023)   [facility]   Hunger Vital Sign    Worried About Running Out of Food in the Last Year: Never true    Ran Out of Food in the Last Year: Never true  Transportation Needs: Not on file (01/03/2023)  Physical Activity: Not on file  Stress: Not on file  Social Connections: Not on file  Intimate Partner Violence: Not on file    Family History:    The patient's family history includes Diabetes in his father; Heart disease in his mother; Hypertension in his mother; Prostate cancer in his father.    ROS:  Please see the history of present illness.  All other ROS reviewed and negative.     Physical Exam/Data:   Vitals:   12/06/23 1745 12/06/23 1815 12/06/23 1830 12/06/23 1845  BP: (!) 140/52 122/62 (!) 154/65 (!) 142/62  Pulse: (!) 44 64 (!) 42 (!) 41  Resp: (!) 23 20 18 10   Temp:  TempSrc:      SpO2: 97% 96% 97% 98%  Weight:      Height:       No intake or output data in the 24 hours ending 12/06/23 1906    12/06/2023    4:43 PM 10/24/2023    9:00 AM 08/14/2023    3:56 PM  Last 3 Weights  Weight (lbs) 212 lb 212 lb 9.6 oz 212 lb  Weight (kg) 96.163 kg 96.435 kg 96.163 kg     Body mass index is 32.23 kg/m.  General:  Well nourished, well developed, in no acute distress HEENT: normal Neck: no JVD Vascular: No carotid bruits; Distal pulses 2+ bilaterally   Cardiac:  normal S1, S2; RRR; no murmur  Lungs:  clear to auscultation bilaterally, no wheezing, rhonchi or rales  Abd: soft, nontender, no hepatomegaly  Ext: no edema Musculoskeletal:  No deformities, BUE and BLE strength normal and equal Skin: warm and dry  Neuro:  CNs 2-12 intact, no focal abnormalities noted Psych:  Normal affect    EKG:  The ECG that was done  was personally reviewed and demonstrates sinus bradycardia with HR 41  Relevant CV Studies:  Echo 01/04/23: SUMMARY  There is mild concentric left ventricular hypertrophy with normal wall motion, normal systolic  function and ejection fraction  55-60% .  The right ventricle is normal size.  The right ventricular systolic function is normal.  The left atrium is mildly dilated.  There is mild mitral regurgitation.  No tricuspid regurgitation.  There is no pericardial effusion.  There is no comparison study available.   Laboratory Data:  High Sensitivity Troponin:   Recent Labs  Lab  12/06/23 1632  TROPONINIHS 7      Chemistry Recent Labs  Lab 12/06/23 1632  NA 141  K 3.8  CL 103  CO2 26  GLUCOSE 95  BUN 24*  CREATININE 1.23  CALCIUM 8.9  MG 2.3  GFRNONAA 60*  ANIONGAP 12    Recent Labs  Lab 12/06/23 1632  PROT 6.5  ALBUMIN 3.2*  AST 18  ALT 9  ALKPHOS 54  BILITOT 0.5   Lipids No results for input(s): "CHOL", "TRIG", "HDL", "LABVLDL", "LDLCALC", "CHOLHDL" in the last 168 hours. Hematology Recent Labs  Lab 12/06/23 1632  WBC 6.8  RBC 4.33  HGB 12.6*  HCT 38.4*  MCV 88.7  MCH 29.1  MCHC 32.8  RDW 15.8*  PLT 292   Thyroid No results for input(s): "TSH", "FREET4" in the last 168 hours. BNPNo results for input(s): "BNP", "PROBNP" in the last 168 hours.  DDimer No results for input(s): "DDIMER" in the last 168 hours.   Radiology/Studies:  No results found.   Assessment and Plan:   Pre-Syncope Transmission from ILR showed evidence of CHB and a pause greater than 5 seconds. However, I do not have a completed interrogation that includes time of the event, which was 2:18 pm. Given bradycardia with AV dissociation and pause on ILR, will admit to cardiology for observation and consideration for PPM by EP.  - will need to obtain full interrogation report - wife presents current med list which does not include the 12.5 mg toprol listed on MAR.  - will need to stop eliquis, last dose was 12/06/23 AM - no indication for emergent temp wire or pacing - I have placed on bedrest   Hypertension - pt wife states he is not taking toprol 12.5 mg listed on home meds - I did not order home hydralazine  or amlodipine to ensure adequate BP given bradycardia in the 40s - may add back as needed   Hyperlipidemia  - continue 20 mg crestor   Parkinson's disease - has been on sinemet   Hx of PE - diagnosed 01/2023, event following lap chole surgery in March 2024 - hold eliquis   Risk Assessment/Risk Scores:    Code Status: Full Code  Severity of  Illness: The appropriate patient status for this patient is INPATIENT. Inpatient status is judged to be reasonable and necessary in order to provide the required intensity of service to ensure the patient's safety. The patient's presenting symptoms, physical exam findings, and initial radiographic and laboratory data in the context of their chronic comorbidities is felt to place them at high risk for further clinical deterioration. Furthermore, it is not anticipated that the patient will be medically stable for discharge from the hospital within 2 midnights of admission.   * I certify that at the point of admission it is my clinical judgment that the patient will require inpatient hospital care spanning beyond 2 midnights from the point of admission due to high intensity of service, high risk for further deterioration and high frequency of surveillance required.*   For questions or updates, please contact [facility] Please consult [email or web address] for contact info under     Signed, Marcelino Duster, [place]  12/06/2023 7:06 PM    Attending Note  Patient seen and discussed with PA Duke, I agree with her documentation. 78 yo male history of HTN, HLD, DM, prior PE, parkinsons disease, , orthostatic syncope, ILR for recurrent syncope followed by EP, presents with near syncopeFrom home HR family check rates in the 30s during episode, he had just gotten up from porch. Family reports episodes of near syncope about every 2-3 days, on my history they are not positional.    K 3.8 Cr 1.23 BUN 24 WBC 6. 8 Hgb 12.6 Plt 292 Mg 2.3  Trop 7--> EKG sinus brady 40s, first degree av block CXR pending   12/02/2023 loop check: isolated 4 sec sinus pause Jan 2023 echo: LVE 60-65%, grade II dd, normal RV function, mild MR  March 4 704 HR 37, ?juncitonal rhythm transiently then no electrical activity for 4 seconds  1.Syncope - recurrent syncope followed by EP - has had some history of prior orthostatic  syncope but recent concerns for cardiac arrhythmia, ILR was placed as outpatient - episode today (March 5th around 2pm), we do not have the ILR data, will need to get tomorrow. ILR data from yesterday around 7AM does show sinus arrest with juntional rhythm 30s and less, 4 seconds of no electrical activity.  - metoprol on med list but not taking, TSH is pending - EKG in ER SR with first degree AV block in the 40s  -npo tonight for EP evaluation tomorrow to consider pacemaker - stable sinus brady here with high bp's, no indication for chronotropic drip or temp pacing.   2. 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    Dina Rich MD

## 2023-12-06 NOTE — ED Notes (Signed)
 Pt given diet sprite, [place] sandwich, saltine and graham crackers along with peanut butter, pt aware may eat up until midnight per diet order

## 2023-12-06 NOTE — ED Triage Notes (Signed)
 Pt BIB [facility] from home d/t witnessed syncopal with approx 10 min. Of being unresponsive, was groaning during down time before he was alert again. EMS reports Pt was A/Ox4 upon their arrival, GCS 15, & Pt does has a [employer] internal recorder for monitoring for pacemaker need. Wife reports the device rep called her during his down time & told her to call 911. She also reports 911 dispatcher had her give him 324 mg ASA. EMS reports pt only c/o dizziness & on scene he had no orthostatic changes (per EMS) SBP 156/sitting & SBP 158/standing. Wife reports during his down time she tried a pulse-ox & is "flashed" 20 bpm & up to a high of 40 bpm. Pt denies CP, SOB, or n/v/d, not on a beta blocker, CBG 158, 99% RA, no PIV. Pt did fall 2 days ago as well & reports pain in his tail bone, denies hitting his head.

## 2023-12-06 NOTE — ED Provider Notes (Signed)
 [place] EMERGENCY DEPARTMENT AT [facility] Provider Note  CSN: [id number] Arrival date & time: 12/06/23 1615  Chief Complaint(s) Bradycardia and Loss of Consciousness  HPI Larry Haynes is a 78 y.o. male history of diabetes, Parkinson's, hypertension, hyperlipidemia, prior pulmonary embolism on Eliquis presenting to the emergency department with syncope.  Per EMS, patient's wife reported that the patient had a syncopal episode for around 10 minutes, was unresponsive during this period.  Patient does not think he lost consciousness but also does not fully recollect the events.  He reports that he was on his back porch and he felt very very weak and lightheaded and told his wife that he needed to lie down.  No nausea or vomiting.  No chest pain.  No shortness of breath.  No back pain.  Did get an alert that he had had some abnormal finding on recently placed loop recorder.  Currently, patient reports he feels weak, but denies any other complaints at this time.  Does report some recent viral symptoms with congestion, cough. Taking mucinex   Past Medical History Past Medical History:  Diagnosis Date   Aortic atherosclerosis (HCC) 2024   noted on CT imaging   Arthritis    Benign paroxysmal positional vertigo of left ear 09/15/2018   Benign prostatic hyperplasia with lower urinary tract symptoms    Bilateral hearing loss    Dizziness 09/15/2018   DM type 2 with diabetic dyslipidemia (HCC)    Elevated PSA    Essential hypertension 11/13/2015   Exertional headache 11/13/2015   Gout    Hyperkeratotic oral lesion 07/15/2015   Hyperlipemia    Hypertension    Panic attacks    Post-concussion headache    Presbycusis of both ears 09/15/2018   Spondylolisthesis at L5-S1 level 08/29/2017   Patient Active Problem List   Diagnosis Date Noted   Syncope 12/06/2023   Parkinson's disease (HCC) 01/03/2023   Pulmonary embolism, bilateral (HCC) 01/03/2023   Abnormality of gait and  mobility 12/23/2022   Status post laparoscopic cholecystectomy 12/23/2022   Arthritis 09/13/2021   Benign prostatic hyperplasia with lower urinary tract symptoms 09/13/2021   DM type 2 with diabetic dyslipidemia (HCC) 09/13/2021   Bilateral hearing loss 09/13/2021   Elevated PSA 09/13/2021   Gout 09/13/2021   Hyperlipemia 09/13/2021   Panic attacks 09/13/2021   Benign paroxysmal positional vertigo of left ear 09/15/2018   Dizziness 09/15/2018   Presbycusis of both ears 09/15/2018   Spondylolisthesis at L5-S1 level 08/29/2017   Exertional headache 11/13/2015   Essential hypertension 11/13/2015   Hyperkeratotic oral lesion 07/15/2015   Home Medication(s) Prior to Admission medications   Medication Sig Start Date End Date Taking? Authorizing Provider  amLODipine (NORVASC) 10 MG tablet Take 10 mg daily by mouth.  05/11/17   [provider]  apixaban (ELIQUIS) 5 MG TABS tablet Take 5 mg by mouth daily.    [provider]  carbidopa-levodopa (SINEMET IR) 25-100 MG tablet Take 1 tablet by mouth 3 (three) times daily before meals. 08/14/23 11/06/24  Penumalli, Glenford Bayley, MD  colchicine 0.6 MG tablet Take 0.6 mg by mouth 2 (two) times daily as needed. 05/16/22   [provider]  doxazosin (CARDURA) 2 MG tablet Take 2 mg by mouth at bedtime. 06/09/21   [provider]  furosemide (LASIX) 40 MG tablet Take 40 mg by mouth daily.    [provider]  glimepiride (AMARYL) 1 MG tablet Take 1 mg by mouth daily. 05/25/21  [provider]  hydrALAZINE (APRESOLINE) 50 MG tablet Take 50 mg by mouth 3 (three) times daily. 04/21/21   [provider]  hydrOXYzine (ATARAX) 25 MG tablet Take 25 mg by mouth 4 (four) times daily as needed. 02/14/22   [provider]  loratadine (CLARITIN) 10 MG tablet Take 10 mg by mouth daily as needed. 02/21/22   [provider]  metoprolol succinate (TOPROL XL) 25 MG 24 hr tablet Take 0.5 tablets (12.5 mg  total) by mouth daily. 09/05/23   Baldo Daub, MD  pantoprazole (PROTONIX) 40 MG tablet Take 40 mg by mouth as needed. 08/07/23   [provider]  potassium chloride (KLOR-CON) 10 MEQ tablet Take 10 mEq by mouth daily. 07/25/23   [provider]  rosuvastatin (CRESTOR) 20 MG tablet Take 20 mg by mouth daily.    [provider]  sertraline (ZOLOFT) 100 MG tablet Take 100 mg by mouth daily. 02/21/22   [provider]  tamsulosin (FLOMAX) 0.4 MG CAPS capsule Take 0.4 mg by mouth daily. 08/04/21   [provider]                                                                                                                                    Past Surgical History Past Surgical History:  Procedure Laterality Date   CATARACT EXTRACTION Bilateral 2015   CHOLECYSTECTOMY  12/22/2022   [facility]   SKIN CANCER EXCISION     BOTTOM  LIP   SPINAL FUSION  08/29/2017   L5-S1 decompression and fusion by Julio Sicks   TONSILLECTOMY  1952   TOTAL KNEE ARTHROPLASTY Left 2010   Family History Family History  Problem Relation Age of Onset   Hypertension Mother    Heart disease Mother    Prostate cancer Father    Diabetes Father     Social History Social History   Tobacco Use   Smoking status: Never    Passive exposure: Never   Smokeless tobacco: Never  Vaping Use   Vaping status: Never Used  Substance Use Topics   Alcohol use: Never   Drug use: Never   Allergies Ace inhibitors, Avelox [moxifloxacin], Losartan potassium, Percocet [oxycodone-acetaminophen], Shrimp (diagnostic), Tomato, Other, and Prednisone  Review of Systems Review of Systems  All other systems reviewed and are negative.   Physical Exam Vital Signs  I have reviewed the triage vital signs BP (!) 142/62   Pulse (!) 41   Temp 97.8 F (36.6 C) (Oral)   Resp 10   Ht 5\' 8"  (1.727 m)   Wt 96.2 kg   SpO2 98%   BMI 32.23 kg/m  Physical Exam Vitals and nursing note  reviewed.  Constitutional:      General: He is not in acute distress.    Appearance: Normal appearance.  HENT:     Mouth/Throat:     Mouth: Mucous membranes are moist.  Eyes:     Conjunctiva/sclera: Conjunctivae normal.  Cardiovascular:     Rate and Rhythm: Regular rhythm. Bradycardia present.  Pulmonary:     Effort: Pulmonary effort is normal. No respiratory distress.     Breath sounds: Normal breath sounds.  Abdominal:     General: Abdomen is flat.     Palpations: Abdomen is soft.     Tenderness: There is no abdominal tenderness.  Musculoskeletal:     Right lower leg: No edema.     Left lower leg: No edema.  Skin:    General: Skin is warm and dry.     Capillary Refill: Capillary refill takes less than 2 seconds.  Neurological:     Mental Status: He is alert and oriented to person, place, and time. Mental status is at baseline.  Psychiatric:        Mood and Affect: Mood normal.        Behavior: Behavior normal.     ED Results and Treatments Labs (all labs ordered are listed, but only abnormal results are displayed) Labs Reviewed  COMPREHENSIVE METABOLIC PANEL - Abnormal; Notable for the following components:      Result Value   BUN 24 (*)    Albumin 3.2 (*)    GFR, Estimated 60 (*)    All other components within normal limits  CBC WITH DIFFERENTIAL/PLATELET - Abnormal; Notable for the following components:   Hemoglobin 12.6 (*)    HCT 38.4 (*)    RDW 15.8 (*)    All other components within normal limits  RESP PANEL BY RT-PCR (RSV, FLU A&B, COVID)  RVPGX2  MAGNESIUM  TSH  CBG MONITORING, ED  TROPONIN I (HIGH SENSITIVITY)  TROPONIN I (HIGH SENSITIVITY)                                                                                                                          Radiology No results found.  Pertinent labs & imaging results that were available during my care of the patient were reviewed by me and considered in my medical decision making (see MDM for  details).  Medications Ordered in ED Medications - No data to display                                                                                                                                   Procedures Procedures  (including critical care time)  Medical Decision Making / ED Course   MDM:  78 year old male presenting to the emergency department with episode of syncope.  Patient well-appearing, patient denies syncope although wife reports that he was unresponsive.  EKG shows sinus bradycardia with first-degree AV block.  Apparently has been having some pauses which may be a trigger for his syncopal event.  Seems less consistent with orthostatic or vasovagal syncope.  He had orthostatics measured with the paramedics and they were normal.  Denies any other symptoms such as chest pain, headaches, shortness of breath to suggest other process such as ACS, intracranial bleeding, pulmonary embolism, pneumothorax, or any other process. Will discuss with cardiology  Clinical Course as of 12/06/23 1934  Wed Dec 06, 2023  1933 Patient admitted by cardiology.  [WS]    Clinical Course User Index [WS] Lonell Grandchild, MD     Additional history obtained: -Additional history obtained from ems and spouse -External records from outside source obtained and reviewed including: Chart review including previous notes, labs, imaging, consultation notes including prior notes    Lab Tests: -I ordered, reviewed, and interpreted labs.   The pertinent results include:   Labs Reviewed  COMPREHENSIVE METABOLIC PANEL - Abnormal; Notable for the following components:      Result Value   BUN 24 (*)    Albumin 3.2 (*)    GFR, Estimated 60 (*)    All other components within normal limits  CBC WITH DIFFERENTIAL/PLATELET - Abnormal; Notable for the following components:   Hemoglobin 12.6 (*)    HCT 38.4 (*)    RDW 15.8 (*)    All other components within normal limits  RESP PANEL BY RT-PCR (RSV,  FLU A&B, COVID)  RVPGX2  MAGNESIUM  TSH  CBG MONITORING, ED  TROPONIN I (HIGH SENSITIVITY)  TROPONIN I (HIGH SENSITIVITY)    Notable for mild anemia   EKG   EKG Interpretation Date/Time:  Wednesday December 06 2023 16:25:06 EST Ventricular Rate:  41 PR Interval:  302 QRS Duration:  102 QT Interval:  495 QTC Calculation: 409 R Axis:   -26  Text Interpretation: Sinus bradycardia Prolonged PR interval Borderline left axis deviation Abnormal R-wave progression, early transition Confirmed by Alvino Blood ([id number]) on 12/06/2023 4:39:57 PM         Imaging Studies ordered: I ordered imaging studies including CXR On my interpretation imaging demonstrates no acute process I independently visualized and interpreted imaging. I agree with the radiologist interpretation   Medicines ordered and prescription drug management: No orders of the defined types were placed in this encounter.   -I have reviewed the patients home medicines and have made adjustments as needed   Consultations Obtained: I requested consultation with the cardiologist,  and discussed lab and imaging findings as well as pertinent plan - they recommend: admission   Cardiac Monitoring: The patient was maintained on a cardiac monitor.  I personally viewed and interpreted the cardiac monitored which showed an underlying rhythm of: sinus bradycardia   Social Determinants of Health:  Diagnosis or treatment significantly limited by social determinants of health: obesity   Reevaluation: After the interventions noted above, I reevaluated the patient and found that their symptoms have improved  Co morbidities that complicate the patient evaluation  Past Medical History:  Diagnosis Date   Aortic atherosclerosis (HCC) 2024   noted on CT imaging   Arthritis    Benign paroxysmal positional vertigo of left ear 09/15/2018   Benign prostatic hyperplasia with lower urinary tract symptoms  Bilateral hearing loss     Dizziness 09/15/2018   DM type 2 with diabetic dyslipidemia (HCC)    Elevated PSA    Essential hypertension 11/13/2015   Exertional headache 11/13/2015   Gout    Hyperkeratotic oral lesion 07/15/2015   Hyperlipemia    Hypertension    Panic attacks    Post-concussion headache    Presbycusis of both ears 09/15/2018   Spondylolisthesis at L5-S1 level 08/29/2017      Dispostion: Disposition decision including need for hospitalization was considered, and patient admitted to the hospital.    Final Clinical Impression(s) / ED Diagnoses Final diagnoses:  Syncope, unspecified syncope type     This chart was dictated using voice recognition software.  Despite best efforts to proofread,  errors can occur which can change the documentation meaning.    Lonell Grandchild, MD 12/06/23 [phone]

## 2023-12-06 NOTE — ED Notes (Signed)
 Pt refused nasal swab

## 2023-12-06 NOTE — Telephone Encounter (Signed)
 Alert received from [employer] for 3-4 pauses that have been non-actionable.   [employer] programmed ILR from 3 seconds to 5 seconds in duration to decrease non-actionable alerts.   Routing to Dr. Elberta Fortis to see if ok with this change.

## 2023-12-06 NOTE — Telephone Encounter (Signed)
  1. Has your device fired? no  2. Is you device beeping? no  3. Are you experiencing draining or swelling at device site?   4. Are you calling to see if we received your device transmission?   5. Have you passed out?  Almost passed out  at 2:18 PM- wife said when this happen, there was no heart rate  Please route to Device Clinic Pool

## 2023-12-06 NOTE — Telephone Encounter (Signed)
 Patient reports he was sitting on his porch and suddenly felt lightheaded and increased fatigue. States he went to lay down and does feel some improvement now. Wife checked BP ~ 137/52 , HR would not register on BP machine. Wife checked manual pulse while on the phone and reports HR was 35 bpm. Patient states he feels fine at this time. Advised to call 911 (patient lives 40 minutes from [facility]). Wife states she will call 911. Encouraged if possible for patient to go to [facility] but if not able to just to be sure patient goes to ER to be seen. Voiced understanding and wife states she will call EMS.  Unable to receive manual transmission d/t ILR 2 and no symptom activator.

## 2023-12-06 NOTE — Progress Notes (Incomplete)
 Attending Note  Patient seen and discussed with PA Duke, I agree with her documentation. 78 yo male history of HTN, HLD, DM, prior PE, parkinsons disease, , orthostatic syncope, ILR for recurrent syncope followed by EP, presents with near syncopeFrom home HR family check rates in the 30s during episode, he had just gotten up from porch. Family reports episodes of near syncope about every 2-3 days, on my history they are not positional.    K 3.8 Cr 1.23 BUN 24 WBC 6. 8 Hgb 12.6 Plt 292 Mg 2.3  Trop 7--> EKG sinus brady 40s, first degree av block CXR pending   12/02/2023 loop check: isolated 4 sec sinus pause Jan 2023 echo: LVE 60-65%, grade II dd, normal RV function, mild MR  March 4 704 HR 37, ?juncitonal rhythm transiently then no electrical activity for 4 seconds  1.Syncope - recurrent syncope followed by EP - has had some history of prior orthostatic syncope but recent concerns for cardiac arrhythmia, ILR was placed as outpatient - episode today (March 5th around 2pm), we do not have the ILR data, will need to get tomorrow. ILR data from yesterday around 7AM does show sinus arrest with juntional rhythm 30s and less, 4 seconds of no electrical activity.  - metoprol on med list but not taking, TSH is pending - EKG in ER SR with first degree AV block in the 40s  -npo tonight for EP evaluation tomorrow to consider pacemaker - stable sinus brady here with high bp's, no indication for chronotropic drip or temp pacing.   2. 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    Dina Rich MD

## 2023-12-06 NOTE — Progress Notes (Signed)
 PHARMACY - ANTICOAGULATION CONSULT NOTE  Pharmacy Consult for [facility] IV Infusion Indication: pulmonary embolus  Allergies  Allergen Reactions   Ace Inhibitors Cough   Avelox [Moxifloxacin]    Losartan Potassium Cough   Percocet [Oxycodone-Acetaminophen]    Shrimp (Diagnostic)    Tomato    Other Hives    Hot fudge   Prednisone Other (See Comments)    hyperactivity    Patient Measurements: Height: 5\' 8"  (172.7 cm) Weight: 96.2 kg (212 lb) IBW/kg (Calculated) : 68.4 Heparin Dosing Weight: 96.2 kg  Vital Signs: Temp: 97.8 F (36.6 C) (03/05 1625) Temp Source: Oral (03/05 1625) BP: 142/62 (03/05 1845) Pulse Rate: 41 (03/05 1845)  Labs: Recent Labs    12/06/23 1632  HGB 12.6*  HCT 38.4*  PLT 292  CREATININE 1.23  TROPONINIHS 7    Estimated Creatinine Clearance: 54.8 mL/min (by C-G formula based on SCr of 1.23 mg/dL).   Medical History: Past Medical History:  Diagnosis Date   Aortic atherosclerosis (HCC) 2024   noted on CT imaging   Arthritis    Benign paroxysmal positional vertigo of left ear 09/15/2018   Benign prostatic hyperplasia with lower urinary tract symptoms    Bilateral hearing loss    Dizziness 09/15/2018   DM type 2 with diabetic dyslipidemia (HCC)    Elevated PSA    Essential hypertension 11/13/2015   Exertional headache 11/13/2015   Gout    Hyperkeratotic oral lesion 07/15/2015   Hyperlipemia    Hypertension    Panic attacks    Post-concussion headache    Presbycusis of both ears 09/15/2018   Spondylolisthesis at L5-S1 level 08/29/2017    Medications:  (Not in a hospital admission)  Scheduled:   [START ON 12/07/2023] insulin aspart  0-15 Units Subcutaneous TID WC   Infusions:  PRN:  Anti-infectives (From admission, onward)    None       Assessment: 67 YOM PMH HTN, HLD, DM, PE (Eliquis PTA), Parkinson's. ED visit 12/05/33 for sycopay episodes and pauses. CBC wnl. Last dose of Eliquis 12/06/23 ~0830.  Goal of Therapy:  aPTT 66-102  seconds Monitor platelets by anticoagulation protocol: Yes   Plan:  Start heparin infusion at 1200 units/hr Check aPTT level in 6 hours and daily while on heparin Continue to monitor H&H and platelets  Wilmer Floor 12/06/2023,7:48 PM

## 2023-12-07 ENCOUNTER — Encounter (HOSPITAL_COMMUNITY)
Admission: EM | Disposition: A | Discharge status: Home / Self Care | Payer: Self-pay | Source: Home / Self Care | Attending: Cardiology

## 2023-12-07 ENCOUNTER — Other Ambulatory Visit (HOSPITAL_COMMUNITY)

## 2023-12-07 ENCOUNTER — Encounter (HOSPITAL_COMMUNITY): Payer: Self-pay | Admitting: Cardiology

## 2023-12-07 DIAGNOSIS — I495 Sick sinus syndrome: Principal | ICD-10-CM

## 2023-12-07 HISTORY — PX: PACEMAKER IMPLANT: EP1218

## 2023-12-07 HISTORY — PX: LOOP RECORDER REMOVAL: EP1215

## 2023-12-07 LAB — APTT: aPTT: 42 s — ABNORMAL HIGH (ref 24–36)

## 2023-12-07 LAB — LIPID PANEL
Cholesterol: 107 mg/dL (ref 0–200)
HDL: 41 mg/dL (ref >40)
LDL Cholesterol: 50 mg/dL (ref 0–99)
Total CHOL/HDL Ratio: 2.6 ratio
Triglycerides: 79 mg/dL (ref <150)
VLDL: 16 mg/dL (ref 0–40)

## 2023-12-07 LAB — BASIC METABOLIC PANEL
Anion gap: 7 (ref 5–15)
BUN: 20 mg/dL (ref 8–23)
CO2: 24 mmol/L (ref 22–32)
Calcium: 8 mg/dL — ABNORMAL LOW (ref 8.9–10.3)
Chloride: 108 mmol/L (ref 98–111)
Creatinine, Ser: 1.16 mg/dL (ref 0.61–1.24)
GFR, Estimated: >60 mL/min (ref >60)
Glucose, Bld: 78 mg/dL (ref 70–99)
Potassium: 3.6 mmol/L (ref 3.5–5.1)
Sodium: 139 mmol/L (ref 135–145)

## 2023-12-07 LAB — CBG MONITORING, ED
Glucose-Capillary: 99 mg/dL (ref 70–99)
Glucose-Capillary: 94 mg/dL (ref 70–99)

## 2023-12-07 LAB — CBC
HCT: 35.6 % — ABNORMAL LOW (ref 39.0–52.0)
Hemoglobin: 11.8 g/dL — ABNORMAL LOW (ref 13.0–17.0)
MCH: 29.2 pg (ref 26.0–34.0)
MCHC: 33.1 g/dL (ref 30.0–36.0)
MCV: 88.1 fL (ref 80.0–100.0)
Platelets: 290 10*3/uL (ref 150–400)
RBC: 4.04 MIL/uL — ABNORMAL LOW (ref 4.22–5.81)
RDW: 15.9 % — ABNORMAL HIGH (ref 11.5–15.5)
WBC: 8.5 10*3/uL (ref 4.0–10.5)
nRBC: 0 % (ref 0.0–0.2)

## 2023-12-07 LAB — GLUCOSE, CAPILLARY
Glucose-Capillary: 111 mg/dL — ABNORMAL HIGH (ref 70–99)
Glucose-Capillary: 106 mg/dL — ABNORMAL HIGH (ref 70–99)

## 2023-12-07 LAB — TSH: TSH: 2.611 u[IU]/mL (ref 0.350–4.500)

## 2023-12-07 LAB — HEMOGLOBIN A1C
Hgb A1c MFr Bld: 5.5 % (ref 4.8–5.6)
Mean Plasma Glucose: 111.15 mg/dL

## 2023-12-07 LAB — SURGICAL PCR SCREEN
MRSA, PCR: NEGATIVE
Staphylococcus aureus: NEGATIVE

## 2023-12-07 SURGERY — PACEMAKER IMPLANT

## 2023-12-07 MED ORDER — SODIUM CHLORIDE 0.9 % IV SOLN: INTRAVENOUS | Status: DC

## 2023-12-07 MED ORDER — FENTANYL CITRATE (PF) 100 MCG/2ML IJ SOLN
INTRAMUSCULAR | Status: DC | PRN
  Administered 2023-12-07: 25 ug via INTRAVENOUS

## 2023-12-07 MED ORDER — SODIUM CHLORIDE 0.9 % IV SOLN
80.0000 mg | INTRAVENOUS | Status: AC
  Filled 2023-12-07: qty 2

## 2023-12-07 MED ORDER — SODIUM CHLORIDE 0.9 % IV SOLN
INTRAVENOUS | Status: AC
  Administered 2023-12-07: 80 mg
  Filled 2023-12-07: qty 2

## 2023-12-07 MED ORDER — CEFAZOLIN SODIUM-DEXTROSE 2-4 GM/100ML-% IV SOLN: 2.0000 g | INTRAVENOUS | Status: AC

## 2023-12-07 MED ORDER — SODIUM CHLORIDE 0.9% FLUSH
3.0000 mL | Freq: Two times a day (BID) | INTRAVENOUS | Status: DC
  Administered 2023-12-07 – 2023-12-08 (×2): 3 mL via INTRAVENOUS

## 2023-12-07 MED ORDER — FENTANYL CITRATE (PF) 100 MCG/2ML IJ SOLN
INTRAMUSCULAR | Status: AC
  Filled 2023-12-07: qty 2

## 2023-12-07 MED ORDER — SODIUM CHLORIDE 0.9% FLUSH: 3.0000 mL | INTRAVENOUS | Status: DC | PRN

## 2023-12-07 MED ORDER — LIDOCAINE HCL (PF) 1 % IJ SOLN
INTRAMUSCULAR | Status: AC
  Filled 2023-12-07: qty 60

## 2023-12-07 MED ORDER — MIDAZOLAM HCL 5 MG/5ML IJ SOLN
INTRAMUSCULAR | Status: AC
  Filled 2023-12-07: qty 5

## 2023-12-07 MED ORDER — LIDOCAINE HCL (PF) 1 % IJ SOLN
INTRAMUSCULAR | Status: DC | PRN
  Administered 2023-12-07: 50 mL
  Administered 2023-12-07: 10 mL

## 2023-12-07 MED ORDER — CEFAZOLIN SODIUM-DEXTROSE 2-4 GM/100ML-% IV SOLN
INTRAVENOUS | Status: AC
  Administered 2023-12-07: 2 g via INTRAVENOUS
  Filled 2023-12-07: qty 100

## 2023-12-07 MED ORDER — MIDAZOLAM HCL 5 MG/5ML IJ SOLN
INTRAMUSCULAR | Status: DC | PRN
Start: 2023-12-07 — End: 2023-12-07
  Administered 2023-12-07: 1 mg via INTRAVENOUS

## 2023-12-07 MED ORDER — SODIUM CHLORIDE 0.9 % IV SOLN: 250.0000 mL | INTRAVENOUS | Status: DC

## 2023-12-07 MED ORDER — CHLORHEXIDINE GLUCONATE 4 % EX SOLN
CUTANEOUS | Status: AC
Start: 2023-12-07 — End: 2023-12-07
  Administered 2023-12-07: 4
  Filled 2023-12-07: qty 15

## 2023-12-07 MED ORDER — CHLORHEXIDINE GLUCONATE 4 % EX SOLN: 60.0000 mL | Freq: Once | CUTANEOUS | Status: DC

## 2023-12-07 MED ORDER — CHLORHEXIDINE GLUCONATE 4 % EX SOLN
60.0000 mL | Freq: Once | CUTANEOUS | Status: DC
  Filled 2023-12-07: qty 45

## 2023-12-07 MED ORDER — HEPARIN (PORCINE) IN NACL 1000-0.9 UT/500ML-% IV SOLN
INTRAVENOUS | Status: DC | PRN
  Administered 2023-12-07: 500 mL

## 2023-12-07 SURGICAL SUPPLY — 13 items
CABLE SURGICAL S-101-97-12 (CABLE) ×2 IMPLANT
CATH RIGHTSITE C315HIS02 (CATHETERS) IMPLANT
IPG PACE AZUR XT DR MRI W1DR01 (Pacemaker) IMPLANT
LEAD CAPSURE NOVUS 5076-52CM (Lead) IMPLANT
LEAD SELECT SECURE 3830 383069 (Lead) IMPLANT
PACE AZURE XT DR MRI W1DR01 (Pacemaker) ×1 IMPLANT
PACK LOOP INSERTION (CUSTOM PROCEDURE TRAY) ×2 IMPLANT
PAD DEFIB RADIO PHYSIO CONN (PAD) ×2 IMPLANT
SELECT SECURE 3830 383069 (Lead) ×1 IMPLANT
SHEATH 7FR PRELUDE SNAP 13 (SHEATH) IMPLANT
SLITTER 6232ADJ (MISCELLANEOUS) IMPLANT
TRAY PACEMAKER INSERTION (PACKS) ×2 IMPLANT
WIRE HI TORQ VERSACORE-J 145CM (WIRE) IMPLANT

## 2023-12-07 NOTE — Consult Note (Addendum)
 Cardiology Consultation   Patient ID: Larry Haynes MRN: [id number]; DOB: [birth year]  Admit date: 12/06/2023 Date of Consult: 12/07/2023  PCP:  Larry Fusi, MD   [facility] Providers Cardiologist:  Larry Herrlich, MD   {   Patient Profile:   Larry Haynes is a 78 y.o. male with a hx of HTN, HLD, DM, Parkinson's, PE (on [facility]), syncope who is being seen 12/07/2023 for the evaluation of symptomatic bradycardia at the request of Larry Haynes.  Device information MDT ILR placed Haynes for recurrent syncope  History of Present Illness:   Larry Haynes saw Larry Haynes, hx of syncope, monitoring with nocturnal pauses, bradycardia, nothing to explain his symptoms, suspect to have SND though not clearly an indication for pacing referred to EP  Saw Larry Haynes, syncope going back to 2022, suspect to have been orthostatic,subsequent events continue with position change as well as in seated position >> ILR placed  Recent discussions on weak spells, patient known to have transient bradycardia, by chart notes nocturnal.  Advised to the ER yesterday with ongoing symptoms and a near syncopal spell  Arrives SB 40's  LABS K+ 3.8 > 3.6 Mag 2.3 BUN/Creat 20/1.16 HS Trop 7, 8 WBC 8.5 H/H 11.8/35 Plts 290 TSH 2.611  CXR neg  Patient reports that for about a year he has had functional decline, perhaps more then that. Has had 18 falls over the same time period, and unclear number of syncopal events that he/his wife apparently disagree on, though plenty of weak spells. An almost constant sense of dizziness Has been diagnosed with parkinson's though he denies any tremor Yesterday was on his porch started to feel poorly and was walking inside when he got so weak, he nearly fell No full syncope  No CP, palpitations, SOB  On 12.5mg  daily of Toprol has had an SVT (170's) on his monitor as well    Past Medical History:  Diagnosis Date   Aortic atherosclerosis  (HCC) Haynes   noted on CT imaging   Arthritis    Benign paroxysmal positional vertigo of left ear 09/15/2018   Benign prostatic hyperplasia with lower urinary tract symptoms    Bilateral hearing loss    Dizziness 09/15/2018   DM type 2 with diabetic dyslipidemia (HCC)    Elevated PSA    Essential hypertension 11/13/2015   Exertional headache 11/13/2015   Gout    Hyperkeratotic oral lesion 07/15/2015   Hyperlipemia    Hypertension    Panic attacks    Post-concussion headache    Presbycusis of both ears 09/15/2018   Spondylolisthesis at L5-S1 level 08/29/2017    Past Surgical History:  Procedure Laterality Date   CATARACT EXTRACTION Bilateral 2015   CHOLECYSTECTOMY  03/21/Haynes   [facility]   SKIN CANCER EXCISION     BOTTOM  LIP   SPINAL FUSION  08/29/2017   L5-S1 decompression and fusion by Larry Haynes   TONSILLECTOMY  1952   TOTAL KNEE ARTHROPLASTY Left 2010     Home Medications:  Prior to Admission medications   Medication Sig Start Date End Date Taking? Authorizing Provider  amLODipine (NORVASC) 10 MG tablet Take 10 mg daily by mouth.  05/11/17  Yes [provider]  apixaban (ELIQUIS) 5 MG TABS tablet Take 5 mg by mouth 2 (two) times daily.   Yes [provider]  aspirin EC 81 MG tablet Take 81 mg by mouth daily. Swallow whole.   Yes [provider]  carbidopa-levodopa (SINEMET IR) 25-100 MG tablet Take 1 tablet by mouth 3 (three) times daily before meals. 08/14/23 11/06/24 Yes Penumalli, Glenford Bayley, MD  colchicine 0.6 MG tablet Take 0.6 mg by mouth 2 (two) times daily as needed. 05/16/22  Yes [provider]  doxazosin (CARDURA) 2 MG tablet Take 2 mg by mouth at bedtime. 06/09/21  Yes [provider]  furosemide (LASIX) 40 MG tablet Take 40 mg by mouth daily.   Yes [provider]  glimepiride (AMARYL) 1 MG tablet Take 1 mg by mouth daily. 05/25/21  Yes [provider]  hydrALAZINE (APRESOLINE) 50 MG tablet  Take 50 mg by mouth 3 (three) times daily. 04/21/21  Yes [provider]  hydrOXYzine (ATARAX) 25 MG tablet Take 25 mg by mouth 3 (three) times daily. 02/14/22  Yes [provider]  loratadine (CLARITIN) 10 MG tablet Take 10 mg by mouth daily as needed. 02/21/22  Yes [provider]  metoprolol succinate (TOPROL XL) 25 MG 24 hr tablet Take 0.5 tablets (12.5 mg total) by mouth daily. 09/05/23  Yes Baldo Daub, MD  potassium chloride (KLOR-CON) 10 MEQ tablet Take 10 mEq by mouth daily. 07/25/23  Yes [provider]  rosuvastatin (CRESTOR) 20 MG tablet Take 20 mg by mouth at bedtime.   Yes [provider]  sertraline (ZOLOFT) 100 MG tablet Take 100 mg by mouth at bedtime. 02/21/22  Yes [provider]  tamsulosin (FLOMAX) 0.4 MG CAPS capsule Take 0.4 mg by mouth at bedtime. 08/04/21  Yes [provider]    Inpatient Medications: Scheduled Meds:  carbidopa-levodopa  1 tablet Oral TID AC   insulin aspart  0-15 Units Subcutaneous TID WC   pantoprazole  40 mg Oral Daily   rosuvastatin  20 mg Oral Daily   sertraline  100 mg Oral Daily   tamsulosin  0.4 mg Oral Daily   Continuous Infusions:  heparin 1,350 Units/hr (12/07/23 0437)   PRN Meds: acetaminophen, ondansetron (ZOFRAN) IV  Allergies:    Allergies  Allergen Reactions   Ace Inhibitors Cough   Avelox [Moxifloxacin]     unknown   Fluoxetine Hcl     unknown   Losartan Potassium Cough   Percocet [Oxycodone-Acetaminophen]     unknown   Shrimp (Diagnostic)     unknown   Tomato     unknown   Other Hives    Hot fudge   Prednisone Other (See Comments)    hyperactivity    Social History:   Social History   Socioeconomic History   Marital status: Married    Spouse name: Larry Haynes   Number of children: 2   Years of education: 12   Highest education level: Not on file  Occupational History    Comment: retired  Tobacco Use   Smoking status: Never    Passive exposure:  Never   Smokeless tobacco: Never  Vaping Use   Vaping status: Never Used  Substance and Sexual Activity   Alcohol use: Never   Drug use: Never   Sexual activity: Not on file  Other Topics Concern   Not on file  Social History Narrative   Lives with wife   No caffeine   Social Drivers of Corporate investment banker Strain: Not on file  Food Insecurity: Low Risk  (4/2/Haynes)   [facility]   Hunger Vital Sign    Worried About Running Out of Food in the Last Year: Never true  Ran Out of Food in the Last Year: Never true  Transportation Needs: Not on file (4/2/Haynes)  Physical Activity: Not on file  Stress: Not on file  Social Connections: Not on file  Intimate Partner Violence: Not on file    Family History:   Family History  Problem Relation Age of Onset   Hypertension Mother    Heart disease Mother    Prostate cancer Father    Diabetes Father      ROS:  Please see the history of present illness.  All other ROS reviewed and negative.     Physical Exam/Data:   Vitals:   12/07/23 0315 12/07/23 0444 12/07/23 0500 12/07/23 0711  BP: 123/62 (!) 132/56 (!) 117/42   Pulse: (!) 36 (!) 39 (!) 38   Resp: 14 20 12    Temp:    97.8 F (36.6 C)  TempSrc:    Oral  SpO2: 94% 93% 95%   Weight:      Height:       No intake or output data in the 24 hours ending 12/07/23 0809    12/06/2023    4:43 PM 10/24/2023    9:00 AM 11/11/Haynes    3:56 PM  Last 3 Weights  Weight (lbs) 212 lb 212 lb 9.6 oz 212 lb  Weight (kg) 96.163 kg 96.435 kg 96.163 kg     Body mass index is 32.23 kg/m.  General:  Well nourished, well developed, in no acute distress HEENT: normal Neck: no JVD Vascular: No carotid bruits; Distal pulses 2+ bilaterally Cardiac:  RRR; bradycardic, no murmurs, gallops or rubs Lungs:  CTA b/l, no wheezing, rhonchi or rales  Abd: soft, nontender Ext: no edema Musculoskeletal:  No deformities Skin: warm and dry  Neuro:  no focal  abnormalities noted Psych:  Normal affect   EKG:  The EKG was personally reviewed and demonstrates:    SB 41, 1st degree AVblock  Telemetry:  Telemetry was personally reviewed and demonstrates:    SB 38-40's, 1st degree AVBlock, PACs   Relevant CV Studies:  10/05/21: TTE  1. Left ventricular ejection fraction, by estimation, is 60 to 65%. The  left ventricle has normal function. The left ventricle has no regional  wall motion abnormalities. There is mild left ventricular hypertrophy.  Left ventricular diastolic parameters  are consistent with Grade II diastolic dysfunction (pseudonormalization).   2. Right ventricular systolic function is normal. The right ventricular  size is normal. There is normal pulmonary artery systolic pressure.   3. The mitral valve is normal in structure. Mild mitral valve  regurgitation. No evidence of mitral stenosis.   4. The aortic valve is normal in structure. Aortic valve regurgitation is  not visualized. No aortic stenosis is present.   5. The inferior vena cava is normal in size with greater than 50%  respiratory variability, suggesting right atrial pressure of 3 mmHg.   Laboratory Data:  High Sensitivity Troponin:   Recent Labs  Lab 12/06/23 1632 12/06/23 1832  TROPONINIHS 7 8     Chemistry Recent Labs  Lab 12/06/23 1632 12/07/23 0521  NA 141 139  K 3.8 3.6  CL 103 108  CO2 26 24  GLUCOSE 95 78  BUN 24* 20  CREATININE 1.23 1.16  CALCIUM 8.9 8.0*  MG 2.3  --   GFRNONAA 60* >60  ANIONGAP 12 7    Recent Labs  Lab 12/06/23 1632  PROT 6.5  ALBUMIN 3.2*  AST 18  ALT 9  ALKPHOS  54  BILITOT 0.5   Lipids  Recent Labs  Lab 12/07/23 0521  CHOL 107  TRIG 79  HDL 41  LDLCALC 50  CHOLHDL 2.6    Hematology Recent Labs  Lab 12/06/23 1632 12/07/23 0521  WBC 6.8 8.5  RBC 4.33 4.04*  HGB 12.6* 11.8*  HCT 38.4* 35.6*  MCV 88.7 88.1  MCH 29.1 29.2  MCHC 32.8 33.1  RDW 15.8* 15.9*  PLT 292 290   Thyroid   Recent Labs  Lab 12/07/23 0521  TSH 2.611    BNPNo results for input(s): "BNP", "PROBNP" in the last 168 hours.  DDimer No results for input(s): "DDIMER" in the last 168 hours.   Radiology/Studies:  DG Chest Portable 1 View Result Date: 12/06/2023 CLINICAL DATA:  Syncope EXAM: PORTABLE CHEST 1 VIEW COMPARISON:  01/20/2016 FINDINGS: Borderline cardiomegaly. Aortic atherosclerosis. Probable electronic recording device over the central chest. No pleural effusion or pneumothorax IMPRESSION: No active disease.  Borderline cardiomegaly Electronically Signed   By: Jasmine Pang M.D.   On: 12/06/2023 19:58     Assessment and Plan:   Near syncope Hx of recurrent syncope  Has had pause episode yesterday of 4 seconds, artifact makes it hard to appreciate completely Has awake rates here to 38-39bpm HR histograms, generally 40's with a large component of HRs < 40 (known nocturnal brady/pauses as well), unable to tell time of day for these as a whole  I think he has symptomatic SB, SND Discussed dx of Parkinson's and pacing won't improve those symptoms (to whatever degree they play a role) He mentioned that Larry Haynes has mentioned he may need pacing and he would like to pursue.  On very low dose Toprol at home > he also has had a PAT/AT on his monitor   Larry Haynes Clemente Dewey see him I suspect we Chiann Goffredo pursue PPM this afternoon OK for some water now > NPO Stop heparin (hx of PE) SCD's  Echo ordered, pending   ADDEND: Larry Haynes has seen the patient Sheryn Aldaz proceed with pacing Echo Gizzelle Lacomb get done shortly   Risk Assessment/Risk Scores:     For questions or updates, please contact [facility] Please consult [email or web address] for contact info under    Signed, Sheilah Pigeon, PA-C  12/07/2023 8:09 AM  I have seen and examined this patient with Francis Dowse.  Agree with above, note added to reflect my findings.  Patient with a history significant for hypertension, hyperlipidemia,  diabetes, PE, Parkinson's disease presented to the hospital with symptomatic bradycardia.  He has had episodes of recurrent syncope.  He has a Medtronic ILR in place.  He has known sinus node dysfunction, though his histograms on his device show a significant left trend.  He feels significant fatigue, shortness of breath.  He is having multiple falls, potentially with syncope.  GEN: Well nourished, well developed, in no acute distress  HEENT: normal  Neck: no JVD, carotid bruits, or masses Cardiac: RRR; no murmurs, rubs, or gallops,no edema  Respiratory:  clear to auscultation bilaterally, normal work of breathing GI: soft, nontender, nondistended, + BS MS: no deformity or atrophy  Skin: warm and dry, device site well healed Neuro:  Strength and sensation are intact Psych: euthymic Haynes, full affect   Sick sinus syndrome: Patient has heart rates that are typically in the 40s to 50s, and do not change with exertion.  Additionally, he has significant fatigue and near syncopal episodes.  He has atrial tachycardia and is thus on metoprolol.  Due to this, we Liban Guedes plan for pacemaker implant.  Risks and benefits have been discussed.  The patient understands the risks and has agreed to the procedure. Near syncope: Plan for pacemaker as above.  ZOEY GILKESON has presented today for surgery, with the diagnosis of sick sinus syndrome.  The various methods of treatment have been discussed with the patient and family. After consideration of risks, benefits and other options for treatment, the patient has consented to  Procedure(s): Pacemaker implant as a surgical intervention .  Risks include but not limited to bleeding, infection, pneumothorax, perforation, tamponade, vascular damage, renal failure, MI, stroke, death, and lead dislodgement . The patient's history has been reviewed, patient examined, no change in status, stable for surgery.  I have reviewed the patient's chart and labs.  Questions were  answered to the patient's satisfaction.    Owin Vignola Elberta Fortis, MD 12/07/2023 2:02 PM   Larry Haynes M. Angelli Baruch MD 12/07/2023 2:00 PM

## 2023-12-07 NOTE — Progress Notes (Signed)
 PHARMACY - ANTICOAGULATION CONSULT NOTE  Pharmacy Consult for UFH IV Infusion Indication: pulmonary embolus  Allergies  Allergen Reactions   Ace Inhibitors Cough   Avelox [Moxifloxacin]     unknown   Fluoxetine Hcl     unknown   Losartan Potassium Cough   Percocet [Oxycodone-Acetaminophen]     unknown   Shrimp (Diagnostic)     unknown   Tomato     unknown   Other Hives    Hot fudge   Prednisone Other (See Comments)    hyperactivity    Patient Measurements: Height: 5\' 8"  (172.7 cm) Weight: 96.2 kg (212 lb) IBW/kg (Calculated) : 68.4 Heparin Dosing Weight: 96.2 kg  Vital Signs: Temp: 98 F (36.7 C) (03/06 0019) Temp Source: Oral (03/06 0019) BP: 123/62 (03/06 0315) Pulse Rate: 36 (03/06 0315)  Labs: Recent Labs    12/06/23 1632 12/06/23 1832 12/07/23 0259  HGB 12.6*  --   --   HCT 38.4*  --   --   PLT 292  --   --   APTT  --   --  42*  CREATININE 1.23  --   --   TROPONINIHS 7 8  --     Estimated Creatinine Clearance: 54.8 mL/min (by C-G formula based on SCr of 1.23 mg/dL).   Medical History: Past Medical History:  Diagnosis Date   Aortic atherosclerosis (HCC) 2024   noted on CT imaging   Arthritis    Benign paroxysmal positional vertigo of left ear 09/15/2018   Benign prostatic hyperplasia with lower urinary tract symptoms    Bilateral hearing loss    Dizziness 09/15/2018   DM type 2 with diabetic dyslipidemia (HCC)    Elevated PSA    Essential hypertension 11/13/2015   Exertional headache 11/13/2015   Gout    Hyperkeratotic oral lesion 07/15/2015   Hyperlipemia    Hypertension    Panic attacks    Post-concussion headache    Presbycusis of both ears 09/15/2018   Spondylolisthesis at L5-S1 level 08/29/2017    Medications:  (Not in a hospital admission)  Scheduled:   carbidopa-levodopa  1 tablet Oral TID AC   insulin aspart  0-15 Units Subcutaneous TID WC   pantoprazole  40 mg Oral Daily   rosuvastatin  20 mg Oral Daily   sertraline   100 mg Oral Daily   tamsulosin  0.4 mg Oral Daily   Infusions:   heparin 1,200 Units/hr (12/06/23 2022)   PRN:  Anti-infectives (From admission, onward)    None      Assessment: 39 YOM PMH HTN, HLD, DM, PE (Eliquis PTA), Parkinson's. ED visit 12/05/33 for sycopay episodes and pauses. CBC wnl. Last dose of Eliquis 12/06/23 ~0830.  3/6 AM update:  aPTT sub-therapeutic   Goal of Therapy:  aPTT 66-102 seconds Monitor platelets by anticoagulation protocol: Yes   Plan:  Inc heparin to 1350 units/hr Heparin level and aPTT in 8 hours  Abran Duke, PharmD, BCPS Clinical Pharmacist Phone: [phone]

## 2023-12-07 NOTE — Telephone Encounter (Signed)
 Note there are multiple pause and brady events noted on device. PER ER note of ILR interrogation there is noted pause with high grade HB.  Most recent: 3 second pause occurring at 7:04am on 12/05/23.  Do not see any flagged events for yesterday 3/5.  Patient did go to [facility] ER and has been admitted for syncope.

## 2023-12-07 NOTE — ED Notes (Signed)
 Assumed pt care from [facility]

## 2023-12-07 NOTE — Discharge Instructions (Addendum)
 After Your Pacemaker (and loop removel)   You have a Medtronic Pacemaker  ACTIVITY Do not lift your arm above shoulder height for 1 week after your procedure. After 7 days, you may progress as below.  You should remove your sling 24 hours after your procedure, unless otherwise instructed by your provider.     Thursday December 14, 2023  Friday December 15, 2023 Saturday December 16, 2023 Sunday December 17, 2023   Do not lift, push, pull, or carry anything over 10 pounds with the affected arm until 6 weeks (Thursday January 18, 2024 ) after your procedure.   You may drive AFTER your wound check, unless you have been told otherwise by your provider.   Ask your healthcare provider when you can go back to work   INCISION/Dressing If you are on a blood thinner such as Coumadin, Xarelto, Eliquis, Plavix, or Pradaxa please confirm with your provider when this should be resumed. Resume Eliquis on 12/11/23   If large square, outer bandage is left in place, this can be removed after 24 hours from your procedure. Do not remove steri-strips or glue as below.   If a PRESSURE DRESSING (a bulky dressing that usually goes up over your shoulder) was applied or left in place, please follow instructions given by your provider on when to return to have this removed.   Monitor your Pacemaker site for redness, swelling, and drainage. Call the device clinic at [phone] if you experience these symptoms or fever/chills.  If your incision is sealed with Steri-strips or staples, you may shower 7 days after your procedure or when told by your provider. Do not remove the steri-strips or let the shower hit directly on your site. You may wash around your site with soap and water.    If you were discharged in a sling, please do not wear this during the day more than 48 hours after your surgery unless otherwise instructed. This may increase the risk of stiffness and soreness in your shoulder.   Avoid lotions, ointments, or  perfumes over your incision until it is well-healed.  You may use a hot tub or a pool AFTER your wound check appointment if the incision is completely closed.  Pacemaker Alerts:  Some alerts are vibratory and others beep. These are NOT emergencies. Please call our office to let us know. If this occurs at night or on weekends, it can wait until the next business day. Send a remote transmission.  If your device is capable of reading fluid status (for heart failure), you will be offered monthly monitoring to review this with you.   DEVICE MANAGEMENT Remote monitoring is used to monitor your pacemaker from home. This monitoring is scheduled every 91 days by our office. It allows [place] to keep an eye on the functioning of your device to ensure it is working properly. You will routinely see your Electrophysiologist annually (more often if necessary).   You should receive your ID card for your new device in 4-8 weeks. Keep this card with you at all times once received. Consider wearing a medical alert bracelet or necklace.  Your Pacemaker may be MRI compatible. This will be discussed at your next office visit/wound check.  You should avoid contact with strong electric or magnetic fields.   Do not use amateur (ham) radio equipment or electric (arc) welding torches. MP3 player headphones with magnets should not be used. Some devices are safe to use if held at least 12 inches (30 cm) from  your Pacemaker. These include power tools, lawn mowers, and speakers. If you are unsure if something is safe to use, ask your health care provider.  When using your cell phone, hold it to the ear that is on the opposite side from the Pacemaker. Do not leave your cell phone in a pocket over the Pacemaker.  You may safely use electric blankets, heating pads, computers, and microwave ovens.  Call the office right away if: You have chest pain. You feel more short of breath than you have felt before. You feel more  light-headed than you have felt before. Your incision starts to open up.  This information is not intended to replace advice given to you by your health care provider. Make sure you discuss any questions you have with your health care provider.

## 2023-12-07 NOTE — TOC CM/SW Note (Signed)
 Transition of Care [facility]) - Inpatient Brief Assessment   Patient Details  Name: Larry Haynes MRN: [id number] Date of Birth: 04-21-1946  Transition of Care [facility]) CM/SW Contact:    Gala Lewandowsky, RN Phone Number: 12/07/2023, 2:04 PM   Clinical Narrative: Patient presented for bradycardia and syncope-Plan for PPM today. PTA patient was from home with spouse. Patient has DME cane, rolling walker, and wheelchair in the home. Spouse drives to PCP appointments. Case Manager will continue to follow for transition of care needs as the patient progresses.    Transition of Care Asessment: Insurance and Status: Insurance coverage has been reviewed Patient has primary care physician: Yes Home environment has been reviewed: reviewed Prior level of function:: independent from home with spouse. Prior/Current Home Services: No current home services Social Drivers of Health Review: SDOH reviewed no interventions necessary Readmission risk has been reviewed: Yes Transition of care needs: transition of care needs identified, TOC will continue to follow

## 2023-12-08 ENCOUNTER — Inpatient Hospital Stay (HOSPITAL_COMMUNITY)

## 2023-12-08 LAB — GLUCOSE, CAPILLARY: Glucose-Capillary: 96 mg/dL (ref 70–99)

## 2023-12-08 NOTE — Care Management Important Message (Signed)
 Important Message  Patient Details  Name: Larry Haynes MRN: [id number] Date of Birth: January 11, 1946   Important Message Given:  Yes - Medicare IM     Renie Ora 12/08/2023, 11:48 AM

## 2023-12-08 NOTE — Plan of Care (Signed)
  Problem: Education: Goal: Ability to describe self-care measures that may prevent or decrease complications (Diabetes Survival Skills Education) will improve Outcome: Adequate for Discharge Goal: Individualized Educational Video(s) Outcome: Adequate for Discharge   Problem: Coping: Goal: Ability to adjust to condition or change in health will improve Outcome: Adequate for Discharge   Problem: Fluid Volume: Goal: Ability to maintain a balanced intake and output will improve Outcome: Adequate for Discharge   Problem: Health Behavior/Discharge Planning: Goal: Ability to identify and utilize available resources and services will improve Outcome: Adequate for Discharge Goal: Ability to manage health-related needs will improve Outcome: Adequate for Discharge   Problem: Metabolic: Goal: Ability to maintain appropriate glucose levels will improve Outcome: Adequate for Discharge   Problem: Nutritional: Goal: Maintenance of adequate nutrition will improve Outcome: Adequate for Discharge Goal: Progress toward achieving an optimal weight will improve Outcome: Adequate for Discharge   Problem: Skin Integrity: Goal: Risk for impaired skin integrity will decrease Outcome: Adequate for Discharge   Problem: Tissue Perfusion: Goal: Adequacy of tissue perfusion will improve Outcome: Adequate for Discharge   Problem: Education: Goal: Knowledge of General Education information will improve Description: Including pain rating scale, medication(s)/side effects and non-pharmacologic comfort measures Outcome: Adequate for Discharge   Problem: Health Behavior/Discharge Planning: Goal: Ability to manage health-related needs will improve Outcome: Adequate for Discharge   Problem: Clinical Measurements: Goal: Ability to maintain clinical measurements within normal limits will improve Outcome: Adequate for Discharge Goal: Will remain free from infection Outcome: Adequate for Discharge Goal:  Diagnostic test results will improve Outcome: Adequate for Discharge Goal: Respiratory complications will improve Outcome: Adequate for Discharge Goal: Cardiovascular complication will be avoided Outcome: Adequate for Discharge   Problem: Activity: Goal: Risk for activity intolerance will decrease Outcome: Adequate for Discharge   Problem: Nutrition: Goal: Adequate nutrition will be maintained Outcome: Adequate for Discharge   Problem: Coping: Goal: Level of anxiety will decrease Outcome: Adequate for Discharge   Problem: Elimination: Goal: Will not experience complications related to bowel motility Outcome: Adequate for Discharge Goal: Will not experience complications related to urinary retention Outcome: Adequate for Discharge   Problem: Pain Managment: Goal: General experience of comfort will improve and/or be controlled Outcome: Adequate for Discharge   Problem: Safety: Goal: Ability to remain free from injury will improve Outcome: Adequate for Discharge   Problem: Skin Integrity: Goal: Risk for impaired skin integrity will decrease Outcome: Adequate for Discharge   Problem: Education: Goal: Knowledge of disease or condition will improve Outcome: Adequate for Discharge Goal: Understanding of medication regimen will improve Outcome: Adequate for Discharge Goal: Individualized Educational Video(s) Outcome: Adequate for Discharge   Problem: Activity: Goal: Ability to tolerate increased activity will improve Outcome: Adequate for Discharge   Problem: Cardiac: Goal: Ability to achieve and maintain adequate cardiopulmonary perfusion will improve Outcome: Adequate for Discharge   Problem: Health Behavior/Discharge Planning: Goal: Ability to safely manage health-related needs after discharge will improve Outcome: Adequate for Discharge   Problem: Education: Goal: Knowledge of cardiac device and self-care will improve Outcome: Adequate for Discharge Goal:  Ability to safely manage health related needs after discharge will improve Outcome: Adequate for Discharge Goal: Individualized Educational Video(s) Outcome: Adequate for Discharge   Problem: Cardiac: Goal: Ability to achieve and maintain adequate cardiopulmonary perfusion will improve Outcome: Adequate for Discharge

## 2023-12-08 NOTE — Plan of Care (Signed)

## 2023-12-08 NOTE — Discharge Summary (Signed)
 ELECTROPHYSIOLOGY PROCEDURE DISCHARGE SUMMARY    Patient ID: Larry Haynes,  MRN: [id number], DOB/AGE: Feb 11, 1946 78 y.o.  Admit date: 12/06/2023 Discharge date: 12/08/2023  Primary Care Physician: Paulina Fusi, MD  Primary Cardiologist: Dr. Dulce Sellar Electrophysiologist: Dr. Elberta Fortis  Primary Discharge Diagnosis:  Symptomatic bradycardia status post pacemaker implantation this admission  Secondary Discharge Diagnosis:  HTN HLD DM Parkinson's PE (historical) Resume home Eliquis on 12/11/23  Allergies  Allergen Reactions   Ace Inhibitors Cough   Avelox [Moxifloxacin]     unknown   Fluoxetine Hcl     unknown   Losartan Potassium Cough   Percocet [Oxycodone-Acetaminophen]     unknown   Shrimp (Diagnostic)     unknown   Tomato     unknown   Other Hives    Hot fudge   Prednisone Other (See Comments)    hyperactivity     Procedures This Admission:  1.  Implantation of a MDT dual chamber PPM on 12/07/23 by Dr Elberta Fortis.   There were no immediate post procedure complications. CXR on 12/08/23 demonstrated no pneumothorax status post device implantation.   Brief HPI: Larry Haynes is a 78 y.o. male was admitted with recurrent weak spells,  near syncope.  Loop interrogation historically with nocturnal pauses and bradycardia.  With this admission, had short pauses, though baseline HR with awake and talking in the ER 39-40 or so. He reported no energy, weak spells, and poor exertional capacity and functional decline over the last year Admitted for further evaluation and EP consult  Hospital Course:  The patient was admitted labs largely unremarkable, felt to have symptomatic bradycardia, on low dose Toprol at home, though has also been observed via his loop to have an SVT/likely a PAT as well.  Recommended proceeding with PPM, he was agreeable He underwent implantation of a PPM with details as outlined in the procedure report.   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   Left MVP programming, will follow outpatient, pace QRS was reasonable should he need to RV pace >> program in the future.   Left chest was without hematoma or ecchymosis.  The device was interrogated and found to be functioning normally.  CXR was obtained and demonstrated no pneumothorax status post device implantation.  Wound care, arm mobility, and restrictions were reviewed with the patient.  The patient feels well, denies any P/SOB, with minimal site discomfort.  He  was examined by Dr. Lalla Brothers and considered stable for discharge to home.   Resume Eliquis 12/11/23, patient is ambulatory  Physical Exam: Vitals:   12/07/23 2351 12/08/23 0351 12/08/23 0500 12/08/23 0829  BP: 110/80 (!) 153/62  120/73  Pulse: 60 60  60  Resp: 11 14    Temp: 97.6 F (36.4 C) (!) 97.5 F (36.4 C)  97.7 F (36.5 C)  TempSrc: Oral Oral  Oral  SpO2: 97% 96%    Weight:   94.3 kg   Height:        GEN- The patient is well appearing, alert and oriented x 3 today.   HEENT: normocephalic, atraumatic; sclera clear, conjunctiva pink; hearing intact; oropharynx clear; neck supple, no JVP Lungs- CTA b/l, normal work of breathing.  No wheezes, rales, rhonchi Heart- RRR, no murmurs, rubs or gallops, PMI not laterally displaced GI- soft, non-tender, non-distended Extremities- no clubbing, cyanosis, or edema MS-  no significant deformity or atrophy Skin- warm and dry, no rash or lesion, left chest without hematoma/ecchymosis Psych- euthymic mood, full affect Neuro- no gross deficits   Labs:   Lab Results  Component Value Date   WBC 8.5 12/07/2023   HGB 11.8 (L) 12/07/2023   HCT 35.6 (L) 12/07/2023   MCV 88.1 12/07/2023   PLT 290 12/07/2023    Recent Labs  Lab 12/06/23 1632 12/07/23 0521  NA 141 139  K 3.8 3.6  CL 103 108  CO2 26 24  BUN 24* 20  CREATININE 1.23 1.16  CALCIUM 8.9  8.0*  PROT 6.5  --   BILITOT 0.5  --   ALKPHOS 54  --   ALT 9  --   AST 18  --   GLUCOSE 95 78    Discharge Medications:  Allergies as of 12/08/2023       Reactions   Ace Inhibitors Cough   Avelox [moxifloxacin]    unknown   Fluoxetine Hcl    unknown   Losartan Potassium Cough   Percocet [oxycodone-acetaminophen]    unknown   Shrimp (diagnostic)    unknown   Tomato    unknown   Other Hives   Hot fudge   Prednisone Other (See Comments)   hyperactivity        Medication List     TAKE these medications    amLODipine 10 MG tablet Commonly known as: NORVASC Take 10 mg daily by mouth.   aspirin EC 81 MG tablet Take 81 mg by mouth daily. Swallow whole.   carbidopa-levodopa 25-100 MG tablet Commonly known as: SINEMET IR Take 1 tablet by mouth 3 (three) times daily before meals.   colchicine 0.6 MG tablet Take 0.6 mg by mouth 2 (two) times daily as needed.   doxazosin 2 MG tablet Commonly known as: CARDURA Take 2 mg by mouth at bedtime.   Eliquis 5 MG Tabs tablet Generic drug: apixaban Take 5 mg by mouth 2 (two) times daily. Notes to patient: Do not resume until 12/11/23   furosemide 40 MG tablet Commonly known as: LASIX Take 40 mg by mouth daily.   glimepiride 1 MG tablet Commonly known as: AMARYL Take 1 mg by mouth daily.   hydrALAZINE 50 MG tablet Commonly known as: APRESOLINE Take 50 mg by mouth 3 (three) times daily.   hydrOXYzine 25 MG tablet Commonly known as: ATARAX Take 25 mg by mouth 3 (three) times daily.   loratadine 10 MG tablet Commonly known as: CLARITIN Take 10 mg by mouth daily as needed.   metoprolol succinate 25 MG 24 hr tablet Commonly known as: Toprol XL Take 0.5 tablets (12.5 mg total) by mouth daily.   potassium chloride 10 MEQ tablet Commonly known as: KLOR-CON Take 10 mEq by mouth daily.   rosuvastatin 20 MG tablet Commonly known as: CRESTOR Take 20 mg by mouth at bedtime.   sertraline 100 MG tablet Commonly  known as: ZOLOFT Take 100 mg by mouth at bedtime.   tamsulosin 0.4 MG Caps capsule Commonly known as: FLOMAX Take 0.4 mg by mouth at bedtime.        Disposition: home Discharge Instructions     Diet - low sodium heart healthy   Complete by: As directed    Increase activity slowly   Complete by: As directed         Duration of Discharge Encounter: 15 minutes, APP time  Norma Fredrickson, PA-C 12/08/2023 12:26 PM

## 2023-12-20 ENCOUNTER — Ambulatory Visit: Attending: Internal Medicine

## 2023-12-20 DIAGNOSIS — R001 Bradycardia, unspecified: Secondary | ICD-10-CM | POA: Diagnosis not present

## 2023-12-20 LAB — CUP PACEART INCLINIC DEVICE CHECK
Date Time Interrogation Session: 20250319203832
Implantable Lead Connection Status: 753985
Implantable Lead Connection Status: 753985
Implantable Lead Implant Date: 20250306
Implantable Lead Implant Date: 20250306
Implantable Lead Location: 753859
Implantable Lead Location: 753860
Implantable Lead Model: 3830
Implantable Lead Model: 5076
Implantable Pulse Generator Implant Date: 20250306

## 2023-12-20 NOTE — Progress Notes (Signed)
 Normal dual chamber pacemaker wound check. Presenting rhythm: AP/VS. Wound well healed. Routine testing performed. Thresholds, sensing, and impedances consistent with implant measurements and at 3.5V safety margin/auto capture until 3 month visit. No episodes. Reviewed arm restrictions to continue for 6 weeks total post op.  Pt enrolled in remote follow-up.

## 2023-12-20 NOTE — Patient Instructions (Signed)

## 2024-01-03 NOTE — Progress Notes (Signed)
 [facility] Summary Report / Loop Recorder

## 2024-01-10 DIAGNOSIS — R1031 Right lower quadrant pain: Secondary | ICD-10-CM | POA: Diagnosis not present

## 2024-01-10 DIAGNOSIS — R109 Unspecified abdominal pain: Secondary | ICD-10-CM | POA: Diagnosis not present

## 2024-01-10 DIAGNOSIS — K5900 Constipation, unspecified: Secondary | ICD-10-CM | POA: Diagnosis not present

## 2024-01-11 DIAGNOSIS — N201 Calculus of ureter: Secondary | ICD-10-CM | POA: Diagnosis not present

## 2024-01-19 ENCOUNTER — Ambulatory Visit (INDEPENDENT_AMBULATORY_CARE_PROVIDER_SITE_OTHER)

## 2024-01-19 DIAGNOSIS — R55 Syncope and collapse: Secondary | ICD-10-CM | POA: Diagnosis not present

## 2024-01-22 LAB — CUP PACEART REMOTE DEVICE CHECK
Battery Remaining Longevity: 109 mo
Battery Voltage: 3.21 V
Brady Statistic AP VP Percent: 0.15 %
Brady Statistic AP VS Percent: 98.82 %
Brady Statistic AS VP Percent: 0.01 %
Brady Statistic AS VS Percent: 1.02 %
Brady Statistic RA Percent Paced: 98.97 %
Brady Statistic RV Percent Paced: 0.16 %
Date Time Interrogation Session: 20250418020603
Implantable Lead Connection Status: 753985
Implantable Lead Connection Status: 753985
Implantable Lead Implant Date: 20250306
Implantable Lead Implant Date: 20250306
Implantable Lead Location: 753859
Implantable Lead Location: 753860
Implantable Lead Model: 3830
Implantable Lead Model: 5076
Implantable Pulse Generator Implant Date: 20250306
Lead Channel Impedance Value: 285 Ohm
Lead Channel Impedance Value: 361 Ohm
Lead Channel Impedance Value: 380 Ohm
Lead Channel Impedance Value: 532 Ohm
Lead Channel Pacing Threshold Amplitude: 1.125 V
Lead Channel Pacing Threshold Amplitude: 1.25 V
Lead Channel Pacing Threshold Pulse Width: 0.4 ms
Lead Channel Pacing Threshold Pulse Width: 0.4 ms
Lead Channel Sensing Intrinsic Amplitude: 1.875 mV
Lead Channel Sensing Intrinsic Amplitude: 1.875 mV
Lead Channel Sensing Intrinsic Amplitude: 25 mV
Lead Channel Sensing Intrinsic Amplitude: 25 mV
Lead Channel Setting Pacing Amplitude: 2.5 V
Lead Channel Setting Pacing Amplitude: 3.5 V
Lead Channel Setting Pacing Pulse Width: 0.4 ms
Lead Channel Setting Sensing Sensitivity: 0.9 mV
Zone Setting Status: 755011
Zone Setting Status: 755011

## 2024-02-12 DIAGNOSIS — R001 Bradycardia, unspecified: Secondary | ICD-10-CM | POA: Diagnosis not present

## 2024-02-12 DIAGNOSIS — Z86711 Personal history of pulmonary embolism: Secondary | ICD-10-CM | POA: Diagnosis not present

## 2024-02-12 DIAGNOSIS — Z86718 Personal history of other venous thrombosis and embolism: Secondary | ICD-10-CM | POA: Diagnosis not present

## 2024-02-12 DIAGNOSIS — E785 Hyperlipidemia, unspecified: Secondary | ICD-10-CM | POA: Diagnosis not present

## 2024-02-12 DIAGNOSIS — R600 Localized edema: Secondary | ICD-10-CM | POA: Diagnosis not present

## 2024-02-12 DIAGNOSIS — I10 Essential (primary) hypertension: Secondary | ICD-10-CM | POA: Diagnosis not present

## 2024-02-12 DIAGNOSIS — E1169 Type 2 diabetes mellitus with other specified complication: Secondary | ICD-10-CM | POA: Diagnosis not present

## 2024-02-12 DIAGNOSIS — G20A1 Parkinson's disease without dyskinesia, without mention of fluctuations: Secondary | ICD-10-CM | POA: Diagnosis not present

## 2024-02-13 ENCOUNTER — Ambulatory Visit: Admitting: Cardiology

## 2024-02-21 ENCOUNTER — Ambulatory Visit: Payer: Self-pay | Admitting: *Deleted

## 2024-02-21 MED ORDER — METOPROLOL SUCCINATE ER 25 MG PO TB24: 25.0000 mg | ORAL_TABLET | Freq: Every day | ORAL | 1 refills | Status: DC

## 2024-02-21 NOTE — Telephone Encounter (Signed)
 Spoke to wife, DPR on file.  Agreeable to medication increase.  Advised to call the office if  there are any issues after increasing.  She is agreeable to plan.

## 2024-02-21 NOTE — Telephone Encounter (Signed)
-----   Message from [facility] sent at 01/23/2024  5:17 PM EDT ----- Remote pacemaker interrogation. Presenting Rhythm:A-paced V-sensed. Battery and lead parameters stable with stable capture and sensing. Device programming is appropriate. Continue remote monitoring.  VT episode, increase toprol  xl to 25 mg

## 2024-02-27 NOTE — Progress Notes (Signed)
 Remote pacemaker transmission.

## 2024-02-27 NOTE — Addendum Note (Signed)
 Addended by: Lott Rouleau A on: 02/27/2024 04:03 PM   Modules accepted: Orders

## 2024-03-20 ENCOUNTER — Ambulatory Visit: Admitting: Cardiology

## 2024-03-26 ENCOUNTER — Ambulatory Visit: Admitting: Cardiology

## 2024-04-03 ENCOUNTER — Ambulatory Visit: Admitting: Cardiology

## 2024-04-17 DIAGNOSIS — R339 Retention of urine, unspecified: Secondary | ICD-10-CM | POA: Diagnosis not present

## 2024-04-19 ENCOUNTER — Telehealth: Payer: Self-pay

## 2024-04-19 ENCOUNTER — Ambulatory Visit

## 2024-04-19 DIAGNOSIS — R55 Syncope and collapse: Secondary | ICD-10-CM

## 2024-04-19 NOTE — Telephone Encounter (Signed)
  Device/model:  Medtronic- [id number] Azure XT DR MRI  MRI compatibility:  Yes  They will need to contact [employer] to have a representative available to put her device in an MRI safe scan mode.  [employer]:  [phone]

## 2024-04-19 NOTE — Telephone Encounter (Signed)
 FYI PT HAS IMPLANTED PACEMAKER     Pre-operative Risk Assessment    Patient Name: Larry Haynes  DOB: [birth year] MRN: [id number]   Date of last office visit: 10/24/23 WILL CAMNITZ, MD Date of next office visit: 05/23/24 WILL CAMNITZ, MD   Request for Surgical Clearance    Procedure:  PROSTATE MRI  Date of Surgery:  Clearance TBD                                Surgeon:  NOT INDICATED Surgeon's Group or Practice Name:  [facility] Phone number:  [phone] Fax number:  [phone]   Type of Clearance Requested:   - Medical  - Pharmacy:  Hold Aspirin and Apixaban (Eliquis)     Type of Anesthesia:  None    Additional requests/questions:  COULD YOU KINDLY CONFIRM THE FOLLOWING: 1. DEVICE MANUFACTURER AND MODEL,       2. MRI COMPATIBILITY STATUS,         3. ANY DEVICE-SPECIFIC PRECAUTIONS OR PROGRAMMING ADJUSTMENT NEEDED PRE/POST-SCAN,     4. WHETER YOU RECOMMEND PROCEEDING WITH THE MRI  Signed, Lucie DELENA Ku   04/19/2024, 2:08 PM

## 2024-04-19 NOTE — Telephone Encounter (Signed)
   Patient Name: Larry Haynes  DOB: 1946/01/02 MRN: [id number]  Primary Cardiologist: Redell Leiter, MD  Chart reviewed as part of pre-operative protocol coverage. Patient pending prostate MRI. Per [facility] device clinic staff:  Device/model:  Medtronic- [id number] Azure XT DR MRI  MRI compatibility:  Yes   They will need to contact [employer] to have a representative available to put his device in an MRI safe scan mode.   [employer]:  [phone]  I will fax this recommendation to requesting service.    Artist Pouch, PA-C 04/19/2024, 3:03 PM

## 2024-04-22 LAB — CUP PACEART REMOTE DEVICE CHECK
Battery Remaining Longevity: 138 mo
Battery Voltage: 3.18 V
Brady Statistic AP VP Percent: 0.22 %
Brady Statistic AP VS Percent: 97.87 %
Brady Statistic AS VP Percent: 0.01 %
Brady Statistic AS VS Percent: 1.9 %
Brady Statistic RA Percent Paced: 98.11 %
Brady Statistic RV Percent Paced: 0.23 %
Date Time Interrogation Session: 20250717202047
Implantable Lead Connection Status: 753985
Implantable Lead Connection Status: 753985
Implantable Lead Implant Date: 20250306
Implantable Lead Implant Date: 20250306
Implantable Lead Location: 753859
Implantable Lead Location: 753860
Implantable Lead Model: 3830
Implantable Lead Model: 5076
Implantable Pulse Generator Implant Date: 20250306
Lead Channel Impedance Value: 304 Ohm
Lead Channel Impedance Value: 380 Ohm
Lead Channel Impedance Value: 437 Ohm
Lead Channel Impedance Value: 551 Ohm
Lead Channel Pacing Threshold Amplitude: 0.625 V
Lead Channel Pacing Threshold Amplitude: 1.125 V
Lead Channel Pacing Threshold Pulse Width: 0.4 ms
Lead Channel Pacing Threshold Pulse Width: 0.4 ms
Lead Channel Sensing Intrinsic Amplitude: 1 mV
Lead Channel Sensing Intrinsic Amplitude: 1 mV
Lead Channel Sensing Intrinsic Amplitude: 22.25 mV
Lead Channel Sensing Intrinsic Amplitude: 22.25 mV
Lead Channel Setting Pacing Amplitude: 2.25 V
Lead Channel Setting Pacing Amplitude: 2.5 V
Lead Channel Setting Pacing Pulse Width: 0.4 ms
Lead Channel Setting Sensing Sensitivity: 0.9 mV
Zone Setting Status: 755011
Zone Setting Status: 755011

## 2024-05-01 ENCOUNTER — Ambulatory Visit: Payer: Self-pay | Admitting: Cardiology

## 2024-05-13 NOTE — Progress Notes (Deleted)
 Cardiology Office Note:    Date:  05/13/2024   ID:  ASH MCELWAIN, DOB 1945/12/03, MRN [id number]  PCP:  Keren Vicenta BRAVO, MD  Cardiologist:  Redell Leiter, MD    Referring MD: Keren Vicenta BRAVO, MD    ASSESSMENT:    1. Presence of permanent cardiac pacemaker   2. Bradycardia   3. Syncope, unspecified syncope type   4. Essential hypertension   5. Mixed hyperlipidemia   6. Chronic pulmonary embolism, unspecified pulmonary embolism type, unspecified whether acute cor pulmonale present (HCC)   7. Chronic anticoagulation    PLAN:    In order of problems listed above:  ***   Next appointment: ***   Medication Adjustments/Labs and Tests Ordered: Current medicines are reviewed at length with the patient today.  Concerns regarding medicines are outlined above.  No orders of the defined types were placed in this encounter.  No orders of the defined types were placed in this encounter.    History of Present Illness:    Larry Haynes is a 78 y.o. male with a hx of hypertension hyperlipidemia bradycardia occurring nocturnally pulmonary embolism with long-term anticoagulation and type 2 diabetes last seen 08/08/2023.  And permanent pacemaker insertion 12/07/2023 for symptomatic bradycardia.  Recent labs 02/13/2023 Compliance with diet, lifestyle and medications: *** Past Medical History:  Diagnosis Date   Aortic atherosclerosis (HCC) 2024   noted on CT imaging   Arthritis    Benign paroxysmal positional vertigo of left ear 09/15/2018   Benign prostatic hyperplasia with lower urinary tract symptoms    Bilateral hearing loss    Dizziness 09/15/2018   DM type 2 with diabetic dyslipidemia (HCC)    Elevated PSA    Essential hypertension 11/13/2015   Exertional headache 11/13/2015   Gout    Hyperkeratotic oral lesion 07/15/2015   Hyperlipemia    Hypertension    Panic attacks    Post-concussion headache    Presbycusis of both ears 09/15/2018   Spondylolisthesis at  L5-S1 level 08/29/2017    Current Medications: No outpatient medications have been marked as taking for the 05/15/24 encounter (Appointment) with Leiter Redell PARAS, MD.      EKGs/Labs/Other Studies Reviewed:    The following studies were reviewed today:  Cardiac Studies & Procedures   ______________________________________________________________________________________________     ECHOCARDIOGRAM  ECHOCARDIOGRAM COMPLETE 10/05/2021  Narrative ECHOCARDIOGRAM REPORT    Patient Name:   JENESIS SUCHY Date of Exam: 10/05/2021 Medical Rec #:  [id number]       Height:       67.0 in Accession #:    [id number]      Weight:       246.0 lb Date of Birth:  [birth year]        BSA:          2.208 m Patient Age:    75 years        BP:           146/72 mmHg Patient Gender: M               HR:           45 bpm. Exam Location:  [facility]  Procedure: 2D Echo, Cardiac Doppler and Color Doppler  Indications:    Nonspecific abnormal electrocardiogram (ECG) (EKG) [R94.31 (ICD-10-CM)]; Near syncope Discordia.Diesel (ICD-10-CM)]  History:        Patient has no prior history of Echocardiogram examinations. Signs/Symptoms:Dizziness/Lightheadedness; Risk Factors:Hypertension and Diabetes.  Sonographer:    Lynwood Silvas  RDCS Referring Phys: [id number] Abrahan Fulmore J Ivanell Deshotel  IMPRESSIONS   1. Left ventricular ejection fraction, by estimation, is 60 to 65%. The left ventricle has normal function. The left ventricle has no regional wall motion abnormalities. There is mild left ventricular hypertrophy. Left ventricular diastolic parameters are consistent with Grade II diastolic dysfunction (pseudonormalization). 2. Right ventricular systolic function is normal. The right ventricular size is normal. There is normal pulmonary artery systolic pressure. 3. The mitral valve is normal in structure. Mild mitral valve regurgitation. No evidence of mitral stenosis. 4. The aortic valve is normal in structure. Aortic valve regurgitation is  not visualized. No aortic stenosis is present. 5. The inferior vena cava is normal in size with greater than 50% respiratory variability, suggesting right atrial pressure of 3 mmHg.  FINDINGS Left Ventricle: Left ventricular ejection fraction, by estimation, is 60 to 65%. The left ventricle has normal function. The left ventricle has no regional wall motion abnormalities. The left ventricular internal cavity size was normal in size. There is mild left ventricular hypertrophy. Left ventricular diastolic parameters are consistent with Grade II diastolic dysfunction (pseudonormalization).  Right Ventricle: The right ventricular size is normal. No increase in right ventricular wall thickness. Right ventricular systolic function is normal. There is normal pulmonary artery systolic pressure. The tricuspid regurgitant velocity is 1.44 m/s, and with an assumed right atrial pressure of 3 mmHg, the estimated right ventricular systolic pressure is 11.3 mmHg.  Left Atrium: Left atrial size was normal in size.  Right Atrium: Right atrial size was normal in size.  Pericardium: There is no evidence of pericardial effusion.  Mitral Valve: The mitral valve is normal in structure. Mild mitral valve regurgitation. No evidence of mitral valve stenosis.  Tricuspid Valve: The tricuspid valve is normal in structure. Tricuspid valve regurgitation is not demonstrated. No evidence of tricuspid stenosis.  Aortic Valve: The aortic valve is normal in structure. Aortic valve regurgitation is not visualized. No aortic stenosis is present.  Pulmonic Valve: The pulmonic valve was normal in structure. Pulmonic valve regurgitation is not visualized. No evidence of pulmonic stenosis.  Aorta: The aortic root is normal in size and structure.  Venous: The inferior vena cava is normal in size with greater than 50% respiratory variability, suggesting right atrial pressure of 3 mmHg.  IAS/Shunts: No atrial level shunt detected by  color flow Doppler.   LEFT VENTRICLE PLAX 2D LVIDd:         5.60 cm   Diastology LVIDs:         3.10 cm   LV e' medial:    5.22 cm/s LV PW:         1.30 cm   LV E/e' medial:  15.2 LV IVS:        1.30 cm   LV e' lateral:   6.09 cm/s LVOT diam:     2.00 cm   LV E/e' lateral: 13.0 LV SV:         106 LV SV Index:   48 LVOT Area:     3.14 cm   RIGHT VENTRICLE             IVC RV S prime:     16.40 cm/s  IVC diam: 1.30 cm TAPSE (M-mode): 3.0 cm  LEFT ATRIUM             Index LA Vol (A2C):   78.5 ml 35.55 ml/m LA Vol (A4C):   67.8 ml 30.70 ml/m LA Biplane Vol: 73.6 ml 33.33 ml/m AORTIC  VALVE LVOT Vmax:   129.00 cm/s LVOT Vmean:  90.000 cm/s LVOT VTI:    0.339 m  AORTA Ao Root diam: 2.90 cm Ao Asc diam:  3.30 cm Ao Desc diam: 2.10 cm  MITRAL VALVE               TRICUSPID VALVE MV Area (PHT): 2.97 cm    TR Peak grad:   8.3 mmHg MV Decel Time: 255 msec    TR Vmax:        144.00 cm/s MV E velocity: 79.30 cm/s MV A velocity: 82.70 cm/s  SHUNTS MV E/A ratio:  0.96        Systemic VTI:  0.34 m Systemic Diam: 2.00 cm  Lamar Fitch MD Electronically signed by Lamar Fitch MD Signature Date/Time: 10/05/2021/1:02:33 PM    Final    MONITORS  LONG TERM MONITOR-LIVE TELEMETRY (3-14 DAYS) 09/04/2023  Narrative Patch Wear Time:  11 days and 17 hours (2024-11-05T16:07:34-0500 to 2024-11-17T10:06:59-0500)  Patient had a min HR of 36 bpm, max HR of 167 bpm, and avg HR of 52 bpm. Predominant underlying rhythm was Sinus Rhythm. First Degree AV Block was present.  There were 15 triggered events. A few are associated with APCs. 1 associated with brief junctional rhythm at a rate of approximately 70 bpm. 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  63 Supraventricular Tachycardia runs occurred,  the run with the fastest interval lasting 2 mins 17secs with a max rate of 167 bpm, the longest lasting 6 mins 30 secs with an avg rate of 144 bpm.  Junctional Rhythm was present.  Supraventricular Tachycardia and Junctional Rhythm were detected within +/- 45 seconds of symptomatic patient event(s).  Isolated SVEs were occasional (1.4%, [id number]), SVE Couplets were rare (<1.0%, 819), and SVE Triplets were rare (<1.0%, 81).  Isolated VEs were rare (<1.0%), VE Couplets were rare (<1.0%), and no VE Triplets were present.       ______________________________________________________________________________________________          Recent Labs: 12/06/2023: ALT 9; Magnesium 2.3 12/07/2023: BUN 20; Creatinine, Ser 1.16; Hemoglobin 11.8; Platelets 290; Potassium 3.6; Sodium 139; TSH 2.611  Recent Lipid Panel    Component Value Date/Time   CHOL 107 12/07/2023 0521   TRIG 79 12/07/2023 0521   HDL 41 12/07/2023 0521   CHOLHDL 2.6 12/07/2023 0521   VLDL 16 12/07/2023 0521   LDLCALC 50 12/07/2023 0521    Physical Exam:    VS:  There were no vitals taken for this visit.    Wt Readings from Last 3 Encounters:  12/08/23 207 lb 14.3 oz (94.3 kg)  10/24/23 212 lb 9.6 oz (96.4 kg)  08/14/23 212 lb (96.2 kg)     GEN: *** Well nourished, well developed in no acute distress HEENT: Normal NECK: No JVD; No carotid bruits LYMPHATICS: No lymphadenopathy CARDIAC: ***RRR, no murmurs, rubs, gallops RESPIRATORY:  Clear to auscultation without rales, wheezing or rhonchi  ABDOMEN: Soft, non-tender, non-distended MUSCULOSKELETAL:  No edema; No deformity  SKIN: Warm and dry NEUROLOGIC:  Alert and oriented x 3 PSYCHIATRIC:  Normal affect    Signed, Redell Leiter, MD  05/13/2024 2:38 PM    [facility]

## 2024-05-14 ENCOUNTER — Other Ambulatory Visit: Payer: Self-pay

## 2024-05-14 DIAGNOSIS — M47812 Spondylosis without myelopathy or radiculopathy, cervical region: Secondary | ICD-10-CM | POA: Insufficient documentation

## 2024-05-15 ENCOUNTER — Ambulatory Visit: Attending: Cardiology | Admitting: Cardiology

## 2024-05-15 DIAGNOSIS — R001 Bradycardia, unspecified: Secondary | ICD-10-CM

## 2024-05-15 DIAGNOSIS — Z7901 Long term (current) use of anticoagulants: Secondary | ICD-10-CM

## 2024-05-15 DIAGNOSIS — E782 Mixed hyperlipidemia: Secondary | ICD-10-CM

## 2024-05-15 DIAGNOSIS — Z950 Presence of cardiac pacemaker: Secondary | ICD-10-CM

## 2024-05-15 DIAGNOSIS — I2782 Chronic pulmonary embolism: Secondary | ICD-10-CM

## 2024-05-15 DIAGNOSIS — I10 Essential (primary) hypertension: Secondary | ICD-10-CM

## 2024-05-15 DIAGNOSIS — R55 Syncope and collapse: Secondary | ICD-10-CM

## 2024-05-23 ENCOUNTER — Ambulatory Visit: Attending: Cardiology | Admitting: Cardiology

## 2024-05-23 ENCOUNTER — Encounter: Payer: Self-pay | Admitting: Cardiology

## 2024-05-23 VITALS — BP 138/66 | HR 71 | Ht 68.0 in | Wt 226.0 lb

## 2024-05-23 DIAGNOSIS — I10 Essential (primary) hypertension: Secondary | ICD-10-CM

## 2024-05-23 DIAGNOSIS — R001 Bradycardia, unspecified: Secondary | ICD-10-CM

## 2024-05-23 DIAGNOSIS — I495 Sick sinus syndrome: Secondary | ICD-10-CM

## 2024-05-23 NOTE — Patient Instructions (Signed)
 Medication Instructions:  Your physician recommends that you continue on your current medications as directed. Please refer to the Current Medication list given to you today.  *If you need a refill on your cardiac medications before your next appointment, please call your pharmacy*  Lab Work: If you have labs (blood work) drawn today and your tests are completely normal, you will receive your results only by: MyChart Message (if you have MyChart) OR A paper copy in the mail If you have any lab test that is abnormal or we need to change your treatment, we will call you to review the results.  Follow-Up: At [facility], you and your health needs are our priority.  As part of our continuing mission to provide you with exceptional heart care, our providers are all part of one team.  This team includes your primary Cardiologist (physician) and Advanced Practice Providers or APPs (Physician Assistants and Nurse Practitioners) who all work together to provide you with the care you need, when you need it.  Your next appointment:   1 year(s)  Provider:   Soyla Norton, MD    We recommend signing up for the patient portal called MyChart.  Sign up information is provided on this After Visit Summary.  MyChart is used to connect with patients for Virtual Visits (Telemedicine).  Patients are able to view lab/test results, encounter notes, upcoming appointments, etc.  Non-urgent messages can be sent to your provider as well.   To learn more about what you can do with MyChart, go to [email or web address].

## 2024-05-23 NOTE — Progress Notes (Signed)
  Electrophysiology Office Note:   Date:  05/23/2024  ID:  Larry Haynes, DOB [birth year], MRN [id number]  Primary Cardiologist: Redell Leiter, MD Primary Heart Failure: None Electrophysiologist: Ludwig Tugwell Gladis Norton, MD      History of Present Illness:   Larry Haynes is a 78 y.o. male with h/o hypertension, hyperlipidemia, diabetes, PE, Parkinson's disease, syncope seen today for routine electrophysiology followup.   Since last being seen in our clinic the patient reports doing well from a cardiac perspective.  He is having issues with dizziness when he stands up.  I have told him this is likely more related to his Parkinson's.  From a cardiac perspective, he has done well.  He has had no issues with his pacemaker.  he denies chest pain, palpitations, dyspnea, PND, orthopnea, nausea, vomiting, dizziness, syncope, edema, weight gain, or early satiety.   Review of systems complete and found to be negative unless listed in HPI.      EP Information / Studies Reviewed:    EKG is ordered today. Personal review as below.  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  Risk Assessment/Calculations:     Physical Exam:   VS:  BP 138/66   Pulse 71   Ht 5' 8 (1.727 m)   Wt 226 lb (102.5 kg)   SpO2 95%   BMI 34.36 kg/m    Wt Readings from Last 3 Encounters:  05/23/24 226 lb (102.5 kg)  12/08/23 207 lb 14.3 oz (94.3 kg)  10/24/23 212  lb 9.6 oz (96.4 kg)     GEN: Well nourished, well developed in no acute distress NECK: No JVD; No carotid bruits CARDIAC: Regular rate and rhythm, no murmurs, rubs, gallops RESPIRATORY:  Clear to auscultation without rales, wheezing or rhonchi  ABDOMEN: Soft, non-tender, non-distended EXTREMITIES:  No edema; No deformity   ASSESSMENT AND PLAN:    SND s/p Medtronic PPM  Normal PPM function See Pace Art report No changes today  2.  Hypertension: Well-controlled  3.  Dizziness: Has dizziness upon standing.  Likely orthostatic and related to his Parkinson's.  For now, no cardiac changes.  Disposition:   Follow up with Dr. Norton in 12 months  Signed, Malayja Freund Gladis Norton, MD

## 2024-05-24 DIAGNOSIS — G20A1 Parkinson's disease without dyskinesia, without mention of fluctuations: Secondary | ICD-10-CM | POA: Diagnosis not present

## 2024-05-24 DIAGNOSIS — E785 Hyperlipidemia, unspecified: Secondary | ICD-10-CM | POA: Diagnosis not present

## 2024-05-24 DIAGNOSIS — Z86718 Personal history of other venous thrombosis and embolism: Secondary | ICD-10-CM | POA: Diagnosis not present

## 2024-05-24 DIAGNOSIS — R001 Bradycardia, unspecified: Secondary | ICD-10-CM | POA: Diagnosis not present

## 2024-05-24 DIAGNOSIS — R600 Localized edema: Secondary | ICD-10-CM | POA: Diagnosis not present

## 2024-05-24 DIAGNOSIS — E1169 Type 2 diabetes mellitus with other specified complication: Secondary | ICD-10-CM | POA: Diagnosis not present

## 2024-05-24 DIAGNOSIS — I10 Essential (primary) hypertension: Secondary | ICD-10-CM | POA: Diagnosis not present

## 2024-05-24 DIAGNOSIS — Z86711 Personal history of pulmonary embolism: Secondary | ICD-10-CM | POA: Diagnosis not present

## 2024-05-24 DIAGNOSIS — N390 Urinary tract infection, site not specified: Secondary | ICD-10-CM | POA: Diagnosis not present

## 2024-05-31 ENCOUNTER — Other Ambulatory Visit (HOSPITAL_COMMUNITY): Payer: Self-pay | Admitting: Nurse Practitioner

## 2024-05-31 DIAGNOSIS — R972 Elevated prostate specific antigen [PSA]: Secondary | ICD-10-CM

## 2024-05-31 DIAGNOSIS — R339 Retention of urine, unspecified: Secondary | ICD-10-CM | POA: Diagnosis not present

## 2024-06-28 NOTE — CV Procedure (Signed)
  Device system confirmed to be MRI conditional, with implant date > 6 weeks ago, and no evidence of abandoned or epicardial leads in review of most recent CXR  Device last cleared by EP Provider: Prentice Passey 06/28/24  Clearance is good through for 1 year as long as parameters remain stable at time of check. If pt undergoes a cardiac device procedure during that time, they should be re-cleared.   Tachy-therapies to be programmed off if applicable with device back to pre-MRI settings after completion of exam.  Medtronic - Programming recommendation received through Medtronic App/Tablet  Rocky Catalan, RT  06/28/2024 9:38 AM

## 2024-07-05 ENCOUNTER — Ambulatory Visit (HOSPITAL_COMMUNITY)
Admission: RE | Admit: 2024-07-05 | Discharge: 2024-07-05 | Disposition: A | Discharge status: Home / Self Care | Source: Ambulatory Visit | Attending: Nurse Practitioner | Admitting: Nurse Practitioner

## 2024-07-05 DIAGNOSIS — R972 Elevated prostate specific antigen [PSA]: Secondary | ICD-10-CM | POA: Insufficient documentation

## 2024-07-05 MED ORDER — GADOBUTROL 1 MMOL/ML IV SOLN
10.0000 mL | Freq: Once | INTRAVENOUS | Status: AC | PRN
  Administered 2024-07-05: 10 mL via INTRAVENOUS

## 2024-07-05 NOTE — Progress Notes (Signed)
  Patient was monitored by this RN during MRI scan due to presence of a pacemaker. Cardiac rhythm was continuously monitored throughout the procedure. Prior to the start of the scan, the pacemaker was placed in MRI-safe mode by the MRI technician and/or pacemaker representative. Following the completion of the scan, the device was returned to its pre-MRI settings. Neurological status and orientation post-procedure were unchanged from baseline.   Pre-procedure Heart Rate (Prior to being placed in MRI safe mode): 64  MRI Mode:  85  Post-procedure Heart Rate (Once pacemaker is returned to baseline mode):  60

## 2024-07-08 NOTE — Progress Notes (Signed)
 Remote PPM Transmission

## 2024-07-19 ENCOUNTER — Ambulatory Visit

## 2024-07-19 DIAGNOSIS — I495 Sick sinus syndrome: Secondary | ICD-10-CM | POA: Diagnosis not present

## 2024-07-23 LAB — CUP PACEART REMOTE DEVICE CHECK
Battery Remaining Longevity: 152 mo
Battery Voltage: 3.14 V
Brady Statistic AP VP Percent: 0.42 %
Brady Statistic AP VS Percent: 98.49 %
Brady Statistic AS VP Percent: 0.01 %
Brady Statistic AS VS Percent: 1.08 %
Brady Statistic RA Percent Paced: 98.92 %
Brady Statistic RV Percent Paced: 0.43 %
Date Time Interrogation Session: 20251017061249
Implantable Lead Connection Status: 753985
Implantable Lead Connection Status: 753985
Implantable Lead Implant Date: 20250306
Implantable Lead Implant Date: 20250306
Implantable Lead Location: 753859
Implantable Lead Location: 753860
Implantable Lead Model: 3830
Implantable Lead Model: 5076
Implantable Pulse Generator Implant Date: 20250306
Lead Channel Impedance Value: 285 Ohm
Lead Channel Impedance Value: 342 Ohm
Lead Channel Impedance Value: 418 Ohm
Lead Channel Impedance Value: 513 Ohm
Lead Channel Pacing Threshold Amplitude: 0.75 V
Lead Channel Pacing Threshold Amplitude: 1.25 V
Lead Channel Pacing Threshold Pulse Width: 0.4 ms
Lead Channel Pacing Threshold Pulse Width: 0.4 ms
Lead Channel Sensing Intrinsic Amplitude: 21.25 mV
Lead Channel Sensing Intrinsic Amplitude: 21.25 mV
Lead Channel Sensing Intrinsic Amplitude: 3.5 mV
Lead Channel Sensing Intrinsic Amplitude: 3.5 mV
Lead Channel Setting Pacing Amplitude: 1.75 V
Lead Channel Setting Pacing Amplitude: 2.5 V
Lead Channel Setting Pacing Pulse Width: 0.4 ms
Lead Channel Setting Sensing Sensitivity: 0.9 mV
Zone Setting Status: 755011
Zone Setting Status: 755011

## 2024-07-24 ENCOUNTER — Ambulatory Visit: Payer: Self-pay | Admitting: Cardiology

## 2024-07-25 NOTE — Progress Notes (Signed)
 Remote PPM Transmission

## 2024-08-13 ENCOUNTER — Ambulatory Visit: Payer: Medicare Other | Admitting: Diagnostic Neuroimaging

## 2024-08-13 ENCOUNTER — Encounter: Payer: Self-pay | Admitting: Diagnostic Neuroimaging

## 2024-08-13 VITALS — BP 124/72 | HR 63 | Ht 68.0 in | Wt 225.8 lb

## 2024-08-13 DIAGNOSIS — G20C Parkinsonism, unspecified: Secondary | ICD-10-CM

## 2024-08-13 DIAGNOSIS — R269 Unspecified abnormalities of gait and mobility: Secondary | ICD-10-CM | POA: Diagnosis not present

## 2024-08-13 NOTE — Patient Instructions (Signed)
  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

## 2024-08-13 NOTE — Progress Notes (Signed)
 [facility]  PATIENT: Larry Haynes DOB: 06-18-1946  REFERRING CLINICIAN: Keren Vicenta BRAVO, MD HISTORY FROM: PATIENT  REASON FOR VISIT: follow up   HISTORICAL  CHIEF COMPLAINT:  Chief Complaint  Patient presents with   RM 6    Patient is here with his wife Dickey for Parkinson's - has more weakness and gets dizzy frequently     HISTORY OF PRESENT ILLNESS:   UPDATE (08/13/24, VRP): Since last visit, doing about the same. Tolerating meds, but not much benefit. Now has pacemaker. No more syncope. Has family  UPDATE (08/14/23, VRP): Since last visit, doing about the same. Still with gait and balance issues. Also with intermittent lightheaded and syncope events at home, sitting down. Now seeing cardiology. Last event was Sept 2024.   UPDATE (08/09/22, VRP): Since last visit, doing better on carb/levo. Still moving around, but balance is off. More fatigue. Not using walker that much.   PRIOR HPI (03/24/22): 78 year old male here for evaluation of gait and balance difficulty.  Has had chronic low back pain for about 10 years.  Has had progressive balance and gait difficulty over the last 3 to 4 years, especially in the last few months.  He was using a walker and cane in the past.  He has had multiple falls.  He feels like his legs are not doing what he is trying to tell him to do.   REVIEW OF SYSTEMS: Full 14 system review of systems performed and negative with exception of: as per HPI.  ALLERGIES: Allergies  Allergen Reactions   Ace Inhibitors Cough   Avelox [Moxifloxacin]     unknown   Fluoxetine Hcl     unknown   Losartan Potassium Cough   Percocet [Oxycodone -Acetaminophen ]     unknown   Shrimp (Diagnostic)     unknown   Tomato     unknown   Other Hives    Hot fudge   Prednisone Other (See Comments)    hyperactivity    HOME MEDICATIONS: Outpatient Medications Prior to Visit  Medication Sig Dispense Refill   amLODipine  (NORVASC ) 10 MG tablet  Take 10 mg daily by mouth.      apixaban (ELIQUIS) 5 MG TABS tablet Take 5 mg by mouth 2 (two) times daily.     aspirin EC 81 MG tablet Take 81 mg by mouth daily. Swallow whole.     carbidopa -levodopa  (SINEMET  IR) 25-100 MG tablet Take 1 tablet by mouth 3 (three) times daily before meals. 270 tablet 4   colchicine 0.6 MG tablet Take 0.6 mg by mouth 2 (two) times daily as needed.     doxazosin (CARDURA) 2 MG tablet Take 2 mg by mouth at bedtime.     furosemide (LASIX) 40 MG tablet Take 40 mg by mouth daily. (Patient taking differently: Take 20 mg by mouth daily.)     glimepiride (AMARYL) 1 MG tablet Take 1 mg by mouth daily.     hydrALAZINE (APRESOLINE) 50 MG tablet Take 50 mg by mouth 3 (three) times daily.     hydrOXYzine (ATARAX) 25 MG tablet Take 25 mg by mouth 3 (three) times daily.     ketorolac  (TORADOL ) 10 MG tablet Take 10 mg by mouth.     loratadine  (CLARITIN ) 10 MG tablet Take 10 mg by mouth daily as needed.     metoprolol  succinate (TOPROL  XL) 25 MG 24 hr tablet Take 1 tablet (25 mg total) by mouth daily. 90 tablet 1   potassium chloride (KLOR-CON)  10 MEQ tablet Take 10 mEq by mouth daily.     rosuvastatin  (CRESTOR ) 20 MG tablet Take 20 mg by mouth at bedtime.     sertraline  (ZOLOFT ) 100 MG tablet Take 100 mg by mouth at bedtime.     tamsulosin  (FLOMAX ) 0.4 MG CAPS capsule Take 0.4 mg by mouth at bedtime.     No facility-administered medications prior to visit.    PAST MEDICAL HISTORY: Past Medical History:  Diagnosis Date   Abnormality of gait and mobility 12/23/2022   Aortic atherosclerosis 2024   noted on CT imaging   Arthritis    Benign paroxysmal positional vertigo of left ear 09/15/2018   Benign prostatic hyperplasia with lower urinary tract symptoms    Bilateral hearing loss    Cervical spondylosis without myelopathy 05/14/2024   Dizziness 09/15/2018   DM type 2 with diabetic dyslipidemia (HCC)    Elevated PSA    Essential hypertension 11/13/2015   Exertional  headache 11/13/2015   Fracture of lumbar spine (HCC) 11/07/2014   Gout    Hyperkeratotic oral lesion 07/15/2015   Hyperlipemia    Hypertension    Lumbar radiculopathy 06/12/2015   Panic attacks    Parkinson's disease (HCC) 01/03/2023   Post-concussion headache    Presbycusis of both ears 09/15/2018   Pulmonary embolism, bilateral (HCC) 01/03/2023   Spondylolisthesis at L5-S1 level 08/29/2017   Status post laparoscopic cholecystectomy 12/23/2022   Syncope 12/06/2023    PAST SURGICAL HISTORY: Past Surgical History:  Procedure Laterality Date   CATARACT EXTRACTION Bilateral 2015   CHOLECYSTECTOMY  12/22/2022   [facility]   LOOP RECORDER REMOVAL N/A 12/07/2023   Procedure: LOOP RECORDER REMOVAL;  Surgeon: Inocencio Soyla Lunger, MD;  Location: [facility] INVASIVE CV LAB;  Service: Cardiovascular;  Laterality: N/A;   PACEMAKER IMPLANT N/A 12/07/2023   Procedure: PACEMAKER IMPLANT;  Surgeon: Inocencio Soyla Lunger, MD;  Location: [facility] INVASIVE CV LAB;  Service: Cardiovascular;  Laterality: N/A;   SKIN CANCER EXCISION     BOTTOM  LIP   SPINAL FUSION  08/29/2017   L5-S1 decompression and fusion by Victory Gunnels   TONSILLECTOMY  1952   TOTAL KNEE ARTHROPLASTY Left 2010    FAMILY HISTORY: Family History  Problem Relation Age of Onset   Hypertension Mother    Heart disease Mother    Prostate cancer Father    Diabetes Father     SOCIAL HISTORY: Social History   Socioeconomic History   Marital status: Married    Spouse name: Dickey   Number of children: 2   Years of education: 12   Highest education level: Not on file  Occupational History    Comment: retired  Tobacco Use   Smoking status: Never    Passive exposure: Never   Smokeless tobacco: Never  Vaping Use   Vaping status: Never Used  Substance and Sexual Activity   Alcohol use: Never   Drug use: Never   Sexual activity: Not on file  Other Topics Concern   Not on file  Social History Narrative   Lives with wife    Caffeine 2 cups of tea and coke or mountain dew monthly    Social Drivers of Health   Financial Resource Strain: Not on file  Food Insecurity: No Food Insecurity (12/07/2023)   Hunger Vital Sign    Worried About Running Out of Food in the Last Year: Never true    Ran Out of Food in the Last Year: Never true  Transportation Needs: No  Transportation Needs (12/07/2023)   PRAPARE - Administrator, Civil Service (Medical): No    Lack of Transportation (Non-Medical): No  Physical Activity: Not on file  Stress: Not on file  Social Connections: Moderately Integrated (12/08/2023)   Social Connection and Isolation Panel    Frequency of Communication with Friends and Family: More than three times a week    Frequency of Social Gatherings with Friends and Family: More than three times a week    Attends Religious Services: 1 to 4 times per year    Active Member of [employer] or Organizations: No    Attends Banker Meetings: Never    Marital Status: Married  Catering Manager Violence: Not At Risk (12/07/2023)   Humiliation, Afraid, Rape, and Kick questionnaire    Fear of Current or Ex-Partner: No    Emotionally Abused: No    Physically Abused: No    Sexually Abused: No     PHYSICAL EXAM  GENERAL EXAM/CONSTITUTIONAL: Vitals:  Vitals:   08/13/24 1530  BP: 124/72  Pulse: 63  SpO2: 98%  Weight: 225 lb 12.8 oz (102.4 kg)  Height: 5' 8 (1.727 m)   Body mass index is 34.33 kg/m. Wt Readings from Last 3 Encounters:  08/13/24 225 lb 12.8 oz (102.4 kg)  05/23/24 226 lb (102.5 kg)  12/08/23 207 lb 14.3 oz (94.3 kg)   Patient is in no distress; well developed, nourished and groomed; neck is supple  CARDIOVASCULAR: Examination of carotid arteries is normal; no carotid bruits Regular rate and rhythm, no murmurs Examination of peripheral vascular system by observation and palpation is normal  EYES: Ophthalmoscopic exam of optic discs and posterior segments is normal; no  papilledema or hemorrhages No results found.  MUSCULOSKELETAL: Gait, strength, tone, movements noted in Neurologic exam below  NEUROLOGIC: MENTAL STATUS:      No data to display         awake, alert, oriented to person, place and time recent and remote memory intact normal attention and concentration language fluent, comprehension intact, naming intact fund of knowledge appropriate  CRANIAL NERVE:  2nd - no papilledema on fundoscopic exam 2nd, 3rd, 4th, 6th - pupils equal and reactive to light, visual fields full to confrontation, extraocular muscles intact, no nystagmus 5th - facial sensation symmetric 7th - facial strength symmetric 8th - hearing intact 9th - palate elevates symmetrically, uvula midline 11th - shoulder shrug symmetric 12th - tongue protrusion midline  MOTOR:  normal bulk and tone, full strength in the BUE, BLE NO BRADYKINESIA INCREASED TONE IN BUE AND BLE  SENSORY:  normal and symmetric to light touch, temperature, vibration; EXCEPT DECR IN FEET  COORDINATION:  finger-nose-finger, fine finger movements normal  REFLEXES:  deep tendon reflexes TRACE and symmetric  GAIT/STATION:  WIDE based gait; STOOPED POSTURE; SLOW TO STAND; SHORT STEPS; EN BLOC TURNING     DIAGNOSTIC DATA (LABS, IMAGING, TESTING) - I reviewed patient records, labs, notes, testing and imaging myself where available.  Lab Results  Component Value Date   WBC 8.5 12/07/2023   HGB 11.8 (L) 12/07/2023   HCT 35.6 (L) 12/07/2023   MCV 88.1 12/07/2023   PLT 290 12/07/2023      Component Value Date/Time   NA 139 12/07/2023 0521   K 3.6 12/07/2023 0521   CL 108 12/07/2023 0521   CO2 24 12/07/2023 0521   GLUCOSE 78 12/07/2023 0521   BUN 20 12/07/2023 0521   CREATININE 1.16 12/07/2023 0521   CALCIUM  8.0 (  L) 12/07/2023 0521   PROT 6.5 12/06/2023 1632   ALBUMIN 3.2 (L) 12/06/2023 1632   AST 18 12/06/2023 1632   ALT 9 12/06/2023 1632   ALKPHOS 54 12/06/2023 1632    BILITOT 0.5 12/06/2023 1632   GFRNONAA >60 12/07/2023 0521   GFRAA >60 08/22/2017 1616   Lab Results  Component Value Date   CHOL 107 12/07/2023   HDL 41 12/07/2023   LDLCALC 50 12/07/2023   TRIG 79 12/07/2023   CHOLHDL 2.6 12/07/2023   Lab Results  Component Value Date   HGBA1C 5.5 12/07/2023   No results found for: [id number] Lab Results  Component Value Date   TSH 2.611 12/07/2023     07/31/15 MRI lumbar spine 1. Moderate multifactorial spinal stenosis at L3-4 and mild spinal stenosis at L2-3, both slightly increased with standing. 2. Bilateral L5 pars defects with grade 1 anterolisthesis and severe bilateral neural foraminal stenosis. 3. Chronic L2 fracture with unchanged right-sided vertebral body height loss.  08/29/21 MRI lumbar spine [I reviewed images myself and agree with interpretation. -VRP]  1. Status post L5-S1 PLIF. No spinal canal stenosis is seen at this level. Overall unchanged moderate bilateral neural foraminal narrowing. 2. L3-L4 mild spinal canal stenosis, which has progressed slightly from the prior exam. 3. L2-L3 mild spinal canal stenosis, unchanged. 4. Narrowing of the lateral recesses at L2-L3, L3-L4, and L4-L5 could affect the descending L3, L4, and L5 nerves, respectively.  02/24/22 MRI brain  1. No acute intracranial abnormality or significant interval change. 2. Stable moderate atrophy and white matter disease. This likely reflects the sequela of chronic microvascular ischemia. 3. 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   08/28/23 MRI of the cervical spine without contrast shows the following: The spinal cord appears normal. At C3-C4, there are mild degenerative changes causing mild spinal stenosis but no nerve pression. At C4-C5, there are degenerative changes causing moderate spinal stenosis and moderately severe left the degenerative changes could affect the C5 nerve roots. At C5-C6, there is a left paramedian disc  protrusion causing mild spinal stenosis and moderate left foraminal narrowing but no nerve root compression. At C6-C7, there are degenerative changes causing mild spinal stenosis or compression.   ASSESSMENT AND PLAN  78 y.o. year old male here with:  Dx:  1. Atypical parkinsonism (HCC)   2. 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   - Please maintain precautions. Do not participate in activities where a loss of awareness could harm you or someone else. No swimming alone, no tub bathing, no hot tubs, no driving, no operating motorized vehicles (cars, ATVs, motocycles, etc), lawnmowers, power tools or firearms. No standing at heights, such as rooftops, ladders or stairs. Avoid hot objects such as stoves, heaters, open fires. Wear a helmet when riding a bicycle, scooter, skateboard, etc. and avoid areas of traffic. Set your water heater  to 120 degrees or less.   No orders of the defined types were placed in this encounter.  Return in about 6 months (around 02/10/2025) for pending if symptoms worsen or fail to improve, pending test results.    EDUARD FABIENE HANLON, MD 08/13/2024, 4:31 PM Certified in Neurology, Neurophysiology and Neuroimaging  [facility] [address], [address] [place], [place] [address] [phone]

## 2024-08-15 ENCOUNTER — Telehealth: Payer: Self-pay | Admitting: Diagnostic Neuroimaging

## 2024-08-15 NOTE — Telephone Encounter (Signed)
 [employer] medicare shara: [id number] exp. 08/15/24-09/29/24 sent to [facility] [phone]

## 2024-09-04 ENCOUNTER — Ambulatory Visit (HOSPITAL_COMMUNITY)
Admission: RE | Admit: 2024-09-04 | Discharge: 2024-09-04 | Disposition: A | Discharge status: Home / Self Care | Source: Ambulatory Visit | Attending: Diagnostic Neuroimaging | Admitting: Diagnostic Neuroimaging

## 2024-09-04 ENCOUNTER — Encounter (HOSPITAL_COMMUNITY)
Admission: RE | Admit: 2024-09-04 | Discharge: 2024-09-04 | Disposition: A | Discharge status: Home / Self Care | Source: Ambulatory Visit | Attending: Diagnostic Neuroimaging | Admitting: Diagnostic Neuroimaging

## 2024-09-04 DIAGNOSIS — R269 Unspecified abnormalities of gait and mobility: Secondary | ICD-10-CM | POA: Diagnosis present

## 2024-09-04 DIAGNOSIS — G20C Parkinsonism, unspecified: Secondary | ICD-10-CM | POA: Insufficient documentation

## 2024-09-04 MED ORDER — IOFLUPANE I 123 185 MBQ/2.5ML IV SOLN
5.0000 | Freq: Once | INTRAVENOUS | Status: AC
  Administered 2024-09-04: 4.3 via INTRAVENOUS
  Filled 2024-09-04: qty 5

## 2024-09-13 ENCOUNTER — Ambulatory Visit: Payer: Self-pay | Admitting: Diagnostic Neuroimaging

## 2024-09-23 ENCOUNTER — Other Ambulatory Visit: Payer: Self-pay | Admitting: Cardiology

## 2024-10-18 ENCOUNTER — Ambulatory Visit (INDEPENDENT_AMBULATORY_CARE_PROVIDER_SITE_OTHER)

## 2024-10-18 DIAGNOSIS — I495 Sick sinus syndrome: Secondary | ICD-10-CM

## 2024-10-21 LAB — CUP PACEART REMOTE DEVICE CHECK
Battery Remaining Longevity: 153 mo
Battery Voltage: 3.09 V
Brady Statistic AP VP Percent: 0.38 %
Brady Statistic AP VS Percent: 98.78 %
Brady Statistic AS VP Percent: 0.01 %
Brady Statistic AS VS Percent: 0.83 %
Brady Statistic RA Percent Paced: 99.16 %
Brady Statistic RV Percent Paced: 0.39 %
Date Time Interrogation Session: 20260115195038
Implantable Lead Connection Status: 753985
Implantable Lead Connection Status: 753985
Implantable Lead Implant Date: 20250306
Implantable Lead Implant Date: 20250306
Implantable Lead Location: 753859
Implantable Lead Location: 753860
Implantable Lead Model: 3830
Implantable Lead Model: 5076
Implantable Pulse Generator Implant Date: 20250306
Lead Channel Impedance Value: 304 Ohm
Lead Channel Impedance Value: 380 Ohm
Lead Channel Impedance Value: 437 Ohm
Lead Channel Impedance Value: 551 Ohm
Lead Channel Pacing Threshold Amplitude: 0.625 V
Lead Channel Pacing Threshold Amplitude: 1.25 V
Lead Channel Pacing Threshold Pulse Width: 0.4 ms
Lead Channel Pacing Threshold Pulse Width: 0.4 ms
Lead Channel Sensing Intrinsic Amplitude: 23.125 mV
Lead Channel Sensing Intrinsic Amplitude: 23.125 mV
Lead Channel Sensing Intrinsic Amplitude: 3 mV
Lead Channel Sensing Intrinsic Amplitude: 3 mV
Lead Channel Setting Pacing Amplitude: 1.5 V
Lead Channel Setting Pacing Amplitude: 2.5 V
Lead Channel Setting Pacing Pulse Width: 0.4 ms
Lead Channel Setting Sensing Sensitivity: 0.9 mV
Zone Setting Status: 755011
Zone Setting Status: 755011

## 2024-10-22 ENCOUNTER — Ambulatory Visit: Payer: Self-pay | Admitting: Cardiology

## 2024-10-23 MED ORDER — METOPROLOL SUCCINATE ER 50 MG PO TB24: 50.0000 mg | ORAL_TABLET | Freq: Every day | ORAL | 3 refills | Status: AC

## 2024-10-24 ENCOUNTER — Telehealth: Payer: Self-pay | Admitting: Cardiology

## 2024-10-24 NOTE — Telephone Encounter (Signed)
 Wife states a copy of patient results needs to be sent to patient PCP office. Please advise

## 2024-10-24 NOTE — Telephone Encounter (Signed)
 Wife asking for recent device transmission results be sent to PCP. Routed/fax via epic. She appreciates our help with getting this over to PCP

## 2024-10-24 NOTE — Progress Notes (Signed)
 Remote PPM Transmission

## 2025-01-17 ENCOUNTER — Encounter

## 2025-04-01 ENCOUNTER — Ambulatory Visit: Admitting: Diagnostic Neuroimaging

## 2025-04-18 ENCOUNTER — Encounter

## 2025-07-18 ENCOUNTER — Encounter

## 2025-10-17 ENCOUNTER — Encounter
